# Patient Record
Sex: Male | Born: 1960 | Race: White | Hispanic: No | Marital: Married
Health system: Southern US, Community
[De-identification: ages and names within clinical notes are randomized; demographics above are authoritative.]

---

## 2017-07-31 DIAGNOSIS — Z125 Encounter for screening for malignant neoplasm of prostate: Secondary | ICD-10-CM | POA: Diagnosis not present

## 2017-07-31 DIAGNOSIS — Z13220 Encounter for screening for lipoid disorders: Secondary | ICD-10-CM | POA: Diagnosis not present

## 2017-07-31 DIAGNOSIS — Z131 Encounter for screening for diabetes mellitus: Secondary | ICD-10-CM | POA: Diagnosis not present

## 2017-08-18 DIAGNOSIS — Z0000 Encounter for general adult medical examination without abnormal findings: Secondary | ICD-10-CM | POA: Diagnosis not present

## 2017-08-18 DIAGNOSIS — Z131 Encounter for screening for diabetes mellitus: Secondary | ICD-10-CM | POA: Diagnosis not present

## 2017-08-18 DIAGNOSIS — Z13220 Encounter for screening for lipoid disorders: Secondary | ICD-10-CM | POA: Diagnosis not present

## 2017-11-10 DIAGNOSIS — M2550 Pain in unspecified joint: Secondary | ICD-10-CM | POA: Diagnosis not present

## 2017-11-10 DIAGNOSIS — R748 Abnormal levels of other serum enzymes: Secondary | ICD-10-CM | POA: Diagnosis not present

## 2017-11-10 DIAGNOSIS — R5383 Other fatigue: Secondary | ICD-10-CM | POA: Diagnosis not present

## 2017-11-11 DIAGNOSIS — R748 Abnormal levels of other serum enzymes: Secondary | ICD-10-CM | POA: Diagnosis not present

## 2017-12-04 DIAGNOSIS — R748 Abnormal levels of other serum enzymes: Secondary | ICD-10-CM | POA: Diagnosis not present

## 2018-07-01 DIAGNOSIS — M1611 Unilateral primary osteoarthritis, right hip: Secondary | ICD-10-CM | POA: Diagnosis not present

## 2018-07-24 DIAGNOSIS — M25551 Pain in right hip: Secondary | ICD-10-CM | POA: Diagnosis not present

## 2018-07-24 DIAGNOSIS — M1611 Unilateral primary osteoarthritis, right hip: Secondary | ICD-10-CM | POA: Diagnosis not present

## 2018-08-03 DIAGNOSIS — R0602 Shortness of breath: Secondary | ICD-10-CM | POA: Diagnosis not present

## 2018-08-03 DIAGNOSIS — R002 Palpitations: Secondary | ICD-10-CM | POA: Diagnosis not present

## 2018-08-20 DIAGNOSIS — Z13220 Encounter for screening for lipoid disorders: Secondary | ICD-10-CM | POA: Diagnosis not present

## 2018-08-20 DIAGNOSIS — Z131 Encounter for screening for diabetes mellitus: Secondary | ICD-10-CM | POA: Diagnosis not present

## 2018-08-20 DIAGNOSIS — R002 Palpitations: Secondary | ICD-10-CM | POA: Diagnosis not present

## 2018-08-20 DIAGNOSIS — Z125 Encounter for screening for malignant neoplasm of prostate: Secondary | ICD-10-CM | POA: Diagnosis not present

## 2018-08-25 DIAGNOSIS — Z0000 Encounter for general adult medical examination without abnormal findings: Secondary | ICD-10-CM | POA: Diagnosis not present

## 2018-08-25 DIAGNOSIS — Z125 Encounter for screening for malignant neoplasm of prostate: Secondary | ICD-10-CM | POA: Diagnosis not present

## 2018-08-25 DIAGNOSIS — R748 Abnormal levels of other serum enzymes: Secondary | ICD-10-CM | POA: Diagnosis not present

## 2019-09-25 ENCOUNTER — Ambulatory Visit: Payer: Self-pay | Attending: Internal Medicine

## 2019-09-25 DIAGNOSIS — Z23 Encounter for immunization: Secondary | ICD-10-CM

## 2019-09-25 NOTE — Progress Notes (Signed)
   Covid-19 Vaccination Clinic  Name:  Khaliel Morey    MRN: [id number] DOB: 11-12-1960  09/25/2019  Mr. Zorn was observed post Covid-19 immunization for 15 minutes without incident. He was provided with Vaccine Information Sheet and instruction to access the V-Safe system.   Mr. Scicchitano was instructed to call 911 with any severe reactions post vaccine: Marland Kitchen Difficulty breathing  . Swelling of face and throat  . A fast heartbeat  . A bad rash all over body  . Dizziness and weakness   Immunizations Administered    Name Date Dose VIS Date Route   Pfizer COVID-19 Vaccine 09/25/2019 11:11 AM 0.3 mL 06/16/2018 Intramuscular   Manufacturer: [employer], [employer]   Lot: [id number]   NDC: [address]-0

## 2019-10-16 ENCOUNTER — Ambulatory Visit: Payer: Self-pay | Attending: Internal Medicine

## 2019-10-16 DIAGNOSIS — Z23 Encounter for immunization: Secondary | ICD-10-CM

## 2019-10-16 NOTE — Progress Notes (Signed)
   Covid-19 Vaccination Clinic  Name:  James Summers    MRN: [id number] DOB: [birth year]  10/16/2019  Mr. [name] was observed post Covid-19 immunization for 15 minutes without incident. He was provided with Vaccine Information Sheet and instruction to access the V-Safe system.   Mr. [name] was instructed to call 911 with any severe reactions post vaccine: Marland Kitchen Difficulty breathing  . Swelling of face and throat  . A fast heartbeat  . A bad rash all over body  . Dizziness and weakness   Immunizations Administered    Name Date Dose VIS Date Route   Pfizer COVID-19 Vaccine 10/16/2019 11:06 AM 0.3 mL 06/16/2018 Intramuscular   Manufacturer: [employer], [employer]   Lot: [id number]   NDC: [address]-1

## 2019-11-17 ENCOUNTER — Ambulatory Visit (HOSPITAL_COMMUNITY): Payer: Commercial Managed Care - PPO | Attending: Orthopedic Surgery | Admitting: Physical Therapy

## 2019-11-17 ENCOUNTER — Other Ambulatory Visit: Payer: Self-pay

## 2019-11-17 ENCOUNTER — Encounter (HOSPITAL_COMMUNITY): Payer: Self-pay | Admitting: Physical Therapy

## 2019-11-17 DIAGNOSIS — M25551 Pain in right hip: Secondary | ICD-10-CM | POA: Insufficient documentation

## 2019-11-17 NOTE — Therapy (Signed)
[facility] [address] [place], [place], [address] Phone: [phone]   Fax:  [phone]  Physical Therapy Evaluation  Patient Details  Name: James Summers MRN: [id number] Date of Birth: Sep 08, 1960 Referring Provider (PT): Margarita Rana, MD   Encounter Date: 11/17/2019   PT End of Session - 11/17/19 1448    Visit Number 1    Number of Visits 12    Date for PT Re-Evaluation 12/29/19    Authorization Type UMR/UHC (No VL, no auth required)    Progress Note Due on Visit 10    PT Start Time 0815    PT Stop Time 0855    PT Time Calculation (min) 40 min    Equipment Utilized During Treatment Gait belt    Activity Tolerance Patient tolerated treatment well    Behavior During Therapy [facility] for tasks assessed/performed           History reviewed. No pertinent past medical history.  History reviewed. No pertinent surgical history.  There were no vitals filed for this visit.    Subjective Assessment - 11/17/19 0825    Subjective Patient reported having a Right THA on 11/11/19. Patient denied any complications following the surgery. Patient did not have any HHPT following surgery. Patient reported that the only precaution he has currently is to not turn his foot outward. Patient is currently walking with a RW, but reported that prior to surgery he was walking without an AD.    Pertinent History RT THA anterior approach 11/11/19    Limitations Standing;Walking;House hold activities    Patient Stated Goals Improved walking and less pain    Currently in Pain? Yes    Pain Score 4     Pain Location Hip    Pain Orientation Right    Pain Descriptors / Indicators Aching    Pain Type Acute pain;Surgical pain    Pain Onset 1 to 4 weeks ago    Pain Frequency Constant    Aggravating Factors  Sitting    Pain Relieving Factors Sitting with his legs elevated              [facility] PT Assessment - 11/17/19 0001      Assessment   Medical Diagnosis  S/P RT anterior THR    Referring Provider (PT) Margarita Rana, MD    Onset Date/Surgical Date 11/11/19    Next MD Visit 12/01/19    Prior Therapy Yes, for ankle and neck      Precautions   Precautions Anterior Hip    Precaution Comments Patient stated he was told not to turn his foot out      Restrictions   Weight Bearing Restrictions No      Balance Screen   Has the patient fallen in the past 6 months No      Home Environment   Living Environment Private residence    Living Arrangements Spouse/significant other    Type of Home House    Home Access Ramped entrance    Home Layout One level    Home Equipment Walker - 2 wheels;Crutches;Cane - single point      Prior Function   Level of Independence Independent    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, carry climbing, bending, twisting      Cognition   Overall Cognitive Status Within Functional Limits for tasks assessed      Observation/Other Assessments   Focus on Therapeutic Outcomes (  FOTO)  32.1%      ROM / Strength   AROM / PROM / Strength Strength      Strength   Strength Assessment Site Hip;Knee;Ankle    Right/Left Hip Right;Left    Right Hip Flexion 2/5    Right Hip Extension 3/5   Tested seated   Right Hip ABduction 3/5   Tested seated   Left Hip Flexion 5/5    Left Hip Extension --   Max resistance - tested seated   Left Hip ABduction --   Max resistance tested seated   Right/Left Knee Right;Left    Right Knee Flexion 3+/5    Right Knee Extension 3-/5    Left Knee Flexion 5/5    Left Knee Extension 5/5    Right/Left Ankle Right;Left    Right Ankle Dorsiflexion 5/5    Left Ankle Dorsiflexion 5/5      Transfers   Comments SIt to stand from chair with UE use bil      Ambulation/Gait   Ambulation/Gait Yes    Ambulation Distance (Feet) 180 Feet     Assistive device Rolling walker    Gait Pattern Antalgic;Decreased stance time - right       Balance   Balance Assessed Yes      Static Standing Balance   Static Standing - Balance Support No upper extremity supported    Static Standing Balance -  Activities  Single Leg Stance - Right Leg;Single Leg Stance - Left Leg    Static Standing - Comment/# of Minutes LT LE: >30 seconds; RT LE: 0 seconds                      Objective measurements completed on examination: See above findings.               PT Education - 11/17/19 1447    Education Details Discussed examination findings, POC, and initial HEP.    Person(s) Educated Patient    Methods Explanation;Handout    Comprehension Verbalized understanding            PT Short Term Goals - 11/17/19 1437      PT SHORT TERM GOAL #1   Title Patient will report understanding and regular compliance with HEP to improve strength, mobility and decrease pain.    Time 3    Period Weeks    Status New    Target Date 12/08/19             PT Long Term Goals - 11/17/19 1437      PT LONG TERM GOAL #1   Title Patient will demonstrate ability to ambulate at least 450 feet on the with LRAD for improved household and community ambulation.    Time 6    Period Weeks    Status New    Target Date 12/29/19      PT LONG TERM GOAL #2   Title Patient will demonstrate ability to perform SLS for at least 15 seconds on the right LE indicating improved balance and safety.    Time 6    Period Weeks    Status New    Target Date 12/29/19      PT LONG TERM GOAL #3   Title Patient will demonstrate ability to perform sit to stand transfer without upper extremity indicating improved LE strength for improved ease of mobility.    Time 6    Period Weeks    Status New  Target Date 12/29/19      PT LONG TERM GOAL #4   Title Patient will demonstrate improvement on FOTO of at least 10% indicating improvement in overall functional mobility.    Time 6    Period Weeks    Status New    Target Date 12/29/19                   Plan - 11/17/19 1449    Clinical Impression Statement Patient is a 59 year old male who presents to outpatient physical therapy S/P RT THA via anterior approach on 11/11/19. Patient presents with post-operative deficits including decreased RT LE strength, decreased ROM and mobility, decreased functional strength, decreased balance and gait deviations as well as decreased gait velocity. Patient was educated on an initial HEP. Patient would benefit from continued skilled physical therapy to address the abovementioned deficits and help patient return to his PLOF.    Personal Factors and Comorbidities Age;Comorbidity 1    Comorbidities Arthritis    Examination-Activity Limitations Stand;Locomotion Level;Bend;Dressing;Stairs;Squat    Examination-Participation Restrictions Yard Work;Meal Prep;Community Activity;Driving    Stability/Clinical Decision Making Stable/Uncomplicated    Clinical Decision Making Low    Rehab Potential Good    PT Frequency 2x / week    PT Duration 6 weeks    PT Treatment/Interventions ADLs/Self Care Home Management;Aquatic Therapy;Cryotherapy;Electrical Stimulation;Moist Heat;DME Instruction;Gait training;Stair training;Functional mobility training;Therapeutic activities;Therapeutic exercise;Balance training;Neuromuscular re-education;Patient/family education;Orthotic Fit/Training;Manual techniques;Passive range of motion;Dry needling;Taping    PT Next Visit Plan Review eval and goals, focus on gait training, LE strengthening, and hip ROM. Anterior total hip - told not to turn foot out.    PT Home Exercise Plan 11/17/19: Heel raises, toe raises, hip abduction at counter    Consulted and Agree with Plan of Care Patient           Patient will benefit from skilled therapeutic intervention in order to improve the following deficits and impairments:  Abnormal gait, Improper body mechanics, Pain, Decreased mobility, Decreased activity tolerance, Decreased endurance,  Decreased range of motion, Decreased strength, Hypomobility, Decreased balance, Difficulty walking  Visit Diagnosis: Pain in right hip     Problem List There are no problems to display for this patient.  Verne Carrow PT, DPT 2:57 PM, 11/17/19 [phone]  [facility] [address] [place], [place], [address] Phone: [phone]   Fax:  [phone]  Name: James Summers MRN: [id number] Date of Birth: 12-15-1960

## 2019-11-17 NOTE — Patient Instructions (Signed)
  Toe / Heel Raise (Standing)    Standing with support, raise heels repeat 10 times. Then raise toes and repeat 10 times. Stand at countertop for support.   Copyright  VHI. All rights reserved.  ABDUCTION: Standing - Resistance Band (Active)    Stand, feet flat. Without resistance band, lift right leg out to side. Stand at counter for support.  Complete _1__ sets of _10__ repetitions. Repeat on the left. Perform _10__ times. Perform 1-2 times a day.   http://gtsc.exer.us/116   Copyright  VHI. All rights reserved.

## 2019-11-19 ENCOUNTER — Encounter (HOSPITAL_COMMUNITY): Payer: Self-pay | Admitting: Physical Therapy

## 2019-11-19 ENCOUNTER — Ambulatory Visit (HOSPITAL_COMMUNITY): Payer: Commercial Managed Care - PPO | Admitting: Physical Therapy

## 2019-11-19 ENCOUNTER — Other Ambulatory Visit: Payer: Self-pay

## 2019-11-19 DIAGNOSIS — M25551 Pain in right hip: Secondary | ICD-10-CM

## 2019-11-19 NOTE — Therapy (Signed)
[facility] [address] [place], [place], [address] Phone: [phone]   Fax:  [phone]  Physical Therapy Treatment  Patient Details  Name: James Summers MRN: [id number] Date of Birth: 06/21/1960 Referring Provider (PT): Margarita Rana, MD   Encounter Date: 11/19/2019   PT End of Session - 11/19/19 0848    Visit Number 2    Number of Visits 12    Date for PT Re-Evaluation 12/29/19    Authorization Type UMR/UHC (No VL, no auth required)    Progress Note Due on Visit 10    PT Start Time [phone]   pt late to session   PT Stop Time 0912    PT Time Calculation (min) 23 min    Equipment Utilized During Treatment Gait belt    Activity Tolerance Patient tolerated treatment well    Behavior During Therapy [facility] for tasks assessed/performed           History reviewed. No pertinent past medical history.  History reviewed. No pertinent surgical history.  There were no vitals filed for this visit.   Subjective Assessment - 11/19/19 0853    Subjective States pain is about the 3/10 in right hip all around hip, described as achy. Movement hurts. States he was doing his exercises yesterday with standing and was doing both legs and afterwards he took a step and he had a sharp pain in the hip general area and then it resulted in burning sensation in groin area and that lasted 2-3 hours. States today he is back to baseline pain symptoms.    Pertinent History RT THA anterior approach 11/11/19    Limitations Standing;Walking;House hold activities    Patient Stated Goals Improved walking and less pain    Currently in Pain? Yes    Pain Score 3     Pain Onset 1 to 4 weeks ago              [facility] PT Assessment - 11/19/19 0001      Assessment   Medical Diagnosis S/P RT anterior THR    Referring Provider (PT) Margarita Rana, MD    Next MD Visit 12/01/19                         [facility] Adult PT Treatment/Exercise - 11/19/19 0001       Exercises   Exercises Knee/Hip      Knee/Hip Exercises: Standing   Other Standing Knee Exercises weight shifts to R leg 50% WB - 5 minutes      Knee/Hip Exercises: Supine   Other Supine Knee/Hip Exercises heel slides wiht strap x10 --> PROM by PT R - 4 minutes      Manual Therapy   Manual Therapy Soft tissue mobilization    Manual therapy comments all manual interventions performed independently of other interventions    Soft tissue mobilization R lateral hip to reduce edema - 6 minutes                  PT Education - 11/19/19 0912    Education Details educated patient in elevation, ROM, stopping exercises where he has to WB on R 100%, on taking medication as prescribed by MD (antiinflammatories prescribed but patient not currently taking). On self massage    Person(s) Educated Patient    Methods Explanation    Comprehension Verbalized understanding            PT Short Term Goals -  11/17/19 1437      PT SHORT TERM GOAL #1   Title Patient will report understanding and regular compliance with HEP to improve strength, mobility and decrease pain.    Time 3    Period Weeks    Status New    Target Date 12/08/19             PT Long Term Goals - 11/17/19 1437      PT LONG TERM GOAL #1   Title Patient will demonstrate ability to ambulate at least 450 feet on the with LRAD for improved household and community ambulation.    Time 6    Period Weeks    Status New    Target Date 12/29/19      PT LONG TERM GOAL #2   Title Patient will demonstrate ability to perform SLS for at least 15 seconds on the right LE indicating improved balance and safety.    Time 6    Period Weeks    Status New    Target Date 12/29/19      PT LONG TERM GOAL #3   Title Patient will demonstrate ability to perform sit to stand transfer without upper extremity indicating improved LE strength for improved ease of mobility.    Time 6    Period Weeks    Status New    Target Date 12/29/19        PT LONG TERM GOAL #4   Title Patient will demonstrate improvement on FOTO of at least 10% indicating improvement in overall functional mobility.    Time 6    Period Weeks    Status New    Target Date 12/29/19                 Plan - 11/19/19 7628    Clinical Impression Statement Session limited secondary to patient's late arrival. Focus was on education and hip ROM. Increased pain noted in hip with recent reports of sharp pain. Assessed hip and bandage with no signs of redness or irritation. Swelling noted throughout hip so edema massage performed, this improved tolerance to heel slide. Added heel slide and weight shifts to HEP and told patient to discontinue 3 way hip on L.    Personal Factors and Comorbidities Age;Comorbidity 1    Comorbidities Arthritis    Examination-Activity Limitations Stand;Locomotion Level;Bend;Dressing;Stairs;Squat    Examination-Participation Restrictions Yard Work;Meal Prep;Community Activity;Driving    Stability/Clinical Decision Making Stable/Uncomplicated    Rehab Potential Good    PT Frequency 2x / week    PT Duration 6 weeks    PT Treatment/Interventions ADLs/Self Care Home Management;Aquatic Therapy;Cryotherapy;Electrical Stimulation;Moist Heat;DME Instruction;Gait training;Stair training;Functional mobility training;Therapeutic activities;Therapeutic exercise;Balance training;Neuromuscular re-education;Patient/family education;Orthotic Fit/Training;Manual techniques;Passive range of motion;Dry needling;Taping    PT Next Visit Plan PROM hip as tolerated ( no ER per pt retriction report), edema massage,  gait training, LE strengthening, and hip ROM. Anterior total hip - told not to turn foot out.    PT Home Exercise Plan 11/17/19: Heel raises, toe raises, hip abduction at counter    Consulted and Agree with Plan of Care Patient           Patient will benefit from skilled therapeutic intervention in order to improve the following deficits and  impairments:  Abnormal gait, Improper body mechanics, Pain, Decreased mobility, Decreased activity tolerance, Decreased endurance, Decreased range of motion, Decreased strength, Hypomobility, Decreased balance, Difficulty walking  Visit Diagnosis: Pain in right hip     Problem List There  are no problems to display for this patient.  9:20 AM, 11/19/19 Tereasa Coop, DPT Physical Therapy with [facility]  [phone] office  [facility] [address] [place], [place], [address] Phone: [phone]   Fax:  [phone]  Name: Raffaele Derise MRN: [id number] Date of Birth: Sep 19, 1960

## 2019-11-22 ENCOUNTER — Encounter (HOSPITAL_COMMUNITY): Payer: Self-pay | Admitting: Physical Therapy

## 2019-11-24 ENCOUNTER — Other Ambulatory Visit: Payer: Self-pay

## 2019-11-24 ENCOUNTER — Ambulatory Visit (HOSPITAL_COMMUNITY): Payer: Commercial Managed Care - PPO | Attending: Orthopedic Surgery | Admitting: Physical Therapy

## 2019-11-24 DIAGNOSIS — M25551 Pain in right hip: Secondary | ICD-10-CM | POA: Diagnosis present

## 2019-11-24 NOTE — Therapy (Signed)
[facility] [address] [place], [place], [address] Phone: [phone]   Fax:  [phone]  Physical Therapy Treatment  Patient Details  Name: James Summers MRN: [id number] Date of Birth: Feb 09, 1961 Referring Provider (PT): Margarita Rana, MD   Encounter Date: 11/24/2019   PT End of Session - 11/24/19 1008    Visit Number 3    Number of Visits 12    Date for PT Re-Evaluation 12/29/19    Authorization Type UMR/UHC (No VL, no auth required)    Progress Note Due on Visit 10    PT Start Time 0838    PT Stop Time 0922    PT Time Calculation (min) 44 min    Equipment Utilized During Treatment Gait belt    Activity Tolerance Patient tolerated treatment well    Behavior During Therapy [facility] for tasks assessed/performed           No past medical history on file.  No past surgical history on file.  There were no vitals filed for this visit.   Subjective Assessment - 11/24/19 0842    Subjective pt with noted antalgia using RW.  States pain is only around 1/10 but if looses balance and places weight on LE quick and hard the pain increases.    Currently in Pain? Yes    Pain Score 1     Pain Location Hip    Pain Orientation Right    Pain Descriptors / Indicators Aching                             [facility] Adult PT Treatment/Exercise - 11/24/19 0001      Ambulation/Gait   Ambulation/Gait Yes    Ambulation/Gait Assistance 5: Supervision    Ambulation Distance (Feet) 100 Feet    Assistive device Straight cane;Small based quad cane    Gait Pattern Antalgic;Decreased stance time - right    Gait Comments cues for sequencing but overall good quality, no pain      Knee/Hip Exercises: Standing   Heel Raises Both;15 reps    Forward Lunges Both;10 reps    Forward Lunges Limitations onto 4" step with UE assist    Hip Abduction Both;10 reps    Hip Extension Both;10 reps    Rocker Board 1 minute    Rocker Board Limitations A/P  and Rt/Lt 10 reps each then balance in middle 1 minute each    Other Standing Knee Exercises tandem stance 30" X 2 in parallel bars      Manual Therapy   Manual Therapy Soft tissue mobilization    Manual therapy comments all manual interventions performed independently of other interventions    Soft tissue mobilization supine to R lateral hip to reduce edema - 6 minutes                  PT Education - 11/24/19 1004    Education Details instructed with gait training using SPC.  Encouraged to practice indoors keeping mind his pain response but continue using RW outdoor/community.    Person(s) Educated Patient    Methods Explanation;Demonstration;Tactile cues;Verbal cues    Comprehension Verbalized understanding;Returned demonstration            PT Short Term Goals - 11/17/19 1437      PT SHORT TERM GOAL #1   Title Patient will report understanding and regular compliance with HEP to improve strength, mobility and decrease pain.  Time 3    Period Weeks    Status New    Target Date 12/08/19             PT Long Term Goals - 11/17/19 1437      PT LONG TERM GOAL #1   Title Patient will demonstrate ability to ambulate at least 450 feet on the with LRAD for improved household and community ambulation.    Time 6    Period Weeks    Status New    Target Date 12/29/19      PT LONG TERM GOAL #2   Title Patient will demonstrate ability to perform SLS for at least 15 seconds on the right LE indicating improved balance and safety.    Time 6    Period Weeks    Status New    Target Date 12/29/19      PT LONG TERM GOAL #3   Title Patient will demonstrate ability to perform sit to stand transfer without upper extremity indicating improved LE strength for improved ease of mobility.    Time 6    Period Weeks    Status New    Target Date 12/29/19      PT LONG TERM GOAL #4   Title Patient will demonstrate improvement on FOTO of at least 10% indicating improvement in  overall functional mobility.    Time 6    Period Weeks    Status New    Target Date 12/29/19                 Plan - 11/24/19 1022    Clinical Impression Statement Pt comes to therapy using RW this session. Reports compliance with HEP. Progressed per deficits working on improving LE strength, balance and progressing gait to LRAD. Pt able to complete all exercise without complaints of pain and demonstrates safe and correct gait using SPC as well. Pt encouraged to practice this at home and add balance exercises as well to HEP (will need printouts next visit).    Personal Factors and Comorbidities Age;Comorbidity 1    Comorbidities Arthritis    Examination-Activity Limitations Stand;Locomotion Level;Bend;Dressing;Stairs;Squat    Examination-Participation Restrictions Yard Work;Meal Prep;Community Activity;Driving    Stability/Clinical Decision Making Stable/Uncomplicated    Rehab Potential Good    PT Frequency 2x / week    PT Duration 6 weeks    PT Treatment/Interventions ADLs/Self Care Home Management;Aquatic Therapy;Cryotherapy;Electrical Stimulation;Moist Heat;DME Instruction;Gait training;Stair training;Functional mobility training;Therapeutic activities;Therapeutic exercise;Balance training;Neuromuscular re-education;Patient/family education;Orthotic Fit/Training;Manual techniques;Passive range of motion;Dry needling;Taping    PT Next Visit Plan Continue to progress gait to LRAD, LE strengthening and stabiltiy.  Update HEP next session to include static balance and standing hip strengthening.  instructed to bring his cane next session to ensure it is adjusted correctly.    PT Home Exercise Plan 11/17/19: Heel raises, toe raises, hip abduction at counter    Consulted and Agree with Plan of Care Patient           Patient will benefit from skilled therapeutic intervention in order to improve the following deficits and impairments:  Abnormal gait, Improper body mechanics, Pain,  Decreased mobility, Decreased activity tolerance, Decreased endurance, Decreased range of motion, Decreased strength, Hypomobility, Decreased balance, Difficulty walking  Visit Diagnosis: Pain in right hip     Problem List There are no problems to display for this patient.  Lurena Nida, PTA/CLT [phone]  Emeline Gins B 11/24/2019, 10:26 AM  [facility] [address] [place], [place], [address] Phone: [phone]   Fax:  [phone]  Name: Yakov Bergen MRN: [id number] Date of Birth: June 14, 1960

## 2019-11-26 ENCOUNTER — Encounter (HOSPITAL_COMMUNITY): Payer: Self-pay | Admitting: Physical Therapy

## 2019-11-26 ENCOUNTER — Ambulatory Visit (HOSPITAL_COMMUNITY): Payer: Commercial Managed Care - PPO | Admitting: Physical Therapy

## 2019-11-26 ENCOUNTER — Other Ambulatory Visit: Payer: Self-pay

## 2019-11-26 DIAGNOSIS — M25551 Pain in right hip: Secondary | ICD-10-CM | POA: Diagnosis not present

## 2019-11-26 NOTE — Patient Instructions (Signed)
HIP / KNEE: Extension - Standing    Squeeze glutes. Raise and lift leg backward. Keep knee straight or slightly bent. _10__ reps per set, _1-2__ sets per day, _7__ days per week Hold onto a support (counter).  Copyright  VHI. All rights reserved.  Feet Heel-Toe "Tandem", Varied Arm Positions - Eyes Open    With eyes open, right foot directly in front of the other, look straight ahead at a stationary object. Stand near a countertop. Hold _10-30___ seconds. Repeat _3___ times per session. Do _1-2___ sessions per day.  Copyright  VHI. All rights reserved.

## 2019-11-26 NOTE — Therapy (Signed)
[facility] [address] [place], [place], [address] Phone: [phone]   Fax:  [phone]  Physical Therapy Treatment  Patient Details  Name: James Summers MRN: [id number] Date of Birth: [birth year] Referring Provider (PT): Margarita Rana, MD   Encounter Date: 11/26/2019   PT End of Session - 11/26/19 0819    Visit Number 4    Number of Visits 12    Date for PT Re-Evaluation 12/29/19    Authorization Type UMR/UHC (No VL, no auth required)    Progress Note Due on Visit 10    PT Start Time 0816    PT Stop Time 0854    PT Time Calculation (min) 38 min    Equipment Utilized During Treatment Gait belt    Activity Tolerance Patient tolerated treatment well    Behavior During Therapy [facility] for tasks assessed/performed           History reviewed. No pertinent past medical history.  History reviewed. No pertinent surgical history.  There were no vitals filed for this visit.   Subjective Assessment - 11/26/19 0818    Subjective Patient reported he is having 1/10 pain currently.    Currently in Pain? Yes    Pain Score 1     Pain Location Hip    Pain Orientation Right    Pain Descriptors / Indicators Aching                             [facility] Adult PT Treatment/Exercise - 11/26/19 0001      Ambulation/Gait   Ambulation/Gait Yes    Ambulation/Gait Assistance 5: Supervision    Ambulation Distance (Feet) 400 Feet    Assistive device Straight cane;Small based quad cane    Gait Pattern Antalgic;Decreased stance time - right    Gait Comments Overall good quality, no increased pain      Knee/Hip Exercises: Standing   Heel Raises Both;15 reps    Heel Raises Limitations Toe raises    Forward Lunges Both;10 reps    Forward Lunges Limitations onto 4" step without UE assist    Hip Abduction Both;10 reps    Hip Extension Both;10 reps    Forward Step Up 1 set;10 reps;Step Height: 4";Both    Forward Step Up Limitations  intermittent HHA    Rocker Board 1 minute    Rocker Board Limitations A/P and Rt/Lt 10 reps each then balance in middle 1 minute each    SLS with Vectors Forward, side, back 5x 3'' HHA as needed. Discontinued due to discomfort when standing on LLE    Other Standing Knee Exercises tandem stance 30" X 2 in parallel bars                    PT Short Term Goals - 11/17/19 1437      PT SHORT TERM GOAL #1   Title Patient will report understanding and regular compliance with HEP to improve strength, mobility and decrease pain.    Time 3    Period Weeks    Status New    Target Date 12/08/19             PT Long Term Goals - 11/17/19 1437      PT LONG TERM GOAL #1   Title Patient will demonstrate ability to ambulate at least 450 feet on the with LRAD for improved household and community ambulation.    Time 6  Period Weeks    Status New    Target Date 12/29/19      PT LONG TERM GOAL #2   Title Patient will demonstrate ability to perform SLS for at least 15 seconds on the right LE indicating improved balance and safety.    Time 6    Period Weeks    Status New    Target Date 12/29/19      PT LONG TERM GOAL #3   Title Patient will demonstrate ability to perform sit to stand transfer without upper extremity indicating improved LE strength for improved ease of mobility.    Time 6    Period Weeks    Status New    Target Date 12/29/19      PT LONG TERM GOAL #4   Title Patient will demonstrate improvement on FOTO of at least 10% indicating improvement in overall functional mobility.    Time 6    Period Weeks    Status New    Target Date 12/29/19                 Plan - 11/26/19 0911    Clinical Impression Statement This session progressed patient with functional strengthening. Added forward step-ups with 4-inch step. Patient demonstrated good form with tandem stance this session, progressed to vector stance to patient's tolerance. Patient did report increased  discomfort during vector stance standing on the left LE therefore discontinued exercise with a seated rest break. Patient reported a decrease in pain symptoms following this. Performed ambulation with cane and discussed slowly progressing to using cane intermittently at home. Also, discussed updated HEP. Patient reported that he was feeling tired but not bad at the end of the session.    Personal Factors and Comorbidities Age;Comorbidity 1    Comorbidities Arthritis    Examination-Activity Limitations Stand;Locomotion Level;Bend;Dressing;Stairs;Squat    Examination-Participation Restrictions Yard Work;Meal Prep;Community Activity;Driving    Stability/Clinical Decision Making Stable/Uncomplicated    Rehab Potential Good    PT Frequency 2x / week    PT Duration 6 weeks    PT Treatment/Interventions ADLs/Self Care Home Management;Aquatic Therapy;Cryotherapy;Electrical Stimulation;Moist Heat;DME Instruction;Gait training;Stair training;Functional mobility training;Therapeutic activities;Therapeutic exercise;Balance training;Neuromuscular re-education;Patient/family education;Orthotic Fit/Training;Manual techniques;Passive range of motion;Dry needling;Taping    PT Next Visit Plan Continue to progress gait to LRAD, LE strengthening and stabiltiy. Continue forward step ups and add lateral step ups as able.    PT Home Exercise Plan 11/17/19: Heel raises, toe raises, hip abduction at counter; 11/26/19: Tandem stance balance and hip extension at counter    Consulted and Agree with Plan of Care Patient           Patient will benefit from skilled therapeutic intervention in order to improve the following deficits and impairments:  Abnormal gait, Improper body mechanics, Pain, Decreased mobility, Decreased activity tolerance, Decreased endurance, Decreased range of motion, Decreased strength, Hypomobility, Decreased balance, Difficulty walking  Visit Diagnosis: Pain in right hip     Problem List There are  no problems to display for this patient.  Verne Carrow PT, DPT 9:17 AM, 11/26/19 [phone]  [facility] [address] [place], [place], [address] Phone: [phone]   Fax:  [phone]  Name: Darrio Bade MRN: [id number] Date of Birth: Jun 03, 1960

## 2019-11-29 ENCOUNTER — Encounter (HOSPITAL_COMMUNITY): Payer: Self-pay | Admitting: Physical Therapy

## 2019-12-01 ENCOUNTER — Other Ambulatory Visit: Payer: Self-pay

## 2019-12-01 ENCOUNTER — Ambulatory Visit (HOSPITAL_COMMUNITY): Payer: Commercial Managed Care - PPO | Admitting: Physical Therapy

## 2019-12-01 ENCOUNTER — Encounter (HOSPITAL_COMMUNITY): Payer: Self-pay | Admitting: Physical Therapy

## 2019-12-01 DIAGNOSIS — M25551 Pain in right hip: Secondary | ICD-10-CM | POA: Diagnosis not present

## 2019-12-01 NOTE — Therapy (Signed)
[facility] [address] [place], [place], [address] Phone: [phone]   Fax:  [phone]  Physical Therapy Treatment  Patient Details  Name: Quintyn Dombek MRN: [id number] Date of Birth: [birth year] Referring Provider (PT): Margarita Rana, MD   Encounter Date: 12/01/2019   PT End of Session - 12/01/19 0825    Visit Number 5    Number of Visits 12    Date for PT Re-Evaluation 12/29/19    Authorization Type UMR/UHC (No VL, no auth required)    Progress Note Due on Visit 10    PT Start Time 0819    PT Stop Time 0858    PT Time Calculation (min) 39 min    Equipment Utilized During Treatment Gait belt    Activity Tolerance Patient tolerated treatment well    Behavior During Therapy [facility] for tasks assessed/performed           History reviewed. No pertinent past medical history.  History reviewed. No pertinent surgical history.  There were no vitals filed for this visit.   Subjective Assessment - 12/01/19 0823    Subjective Patient reported doing well with the cane so far. He reports 1.5-2/10 pain currently.    Currently in Pain? Yes    Pain Score 2     Pain Location Hip    Pain Orientation Right    Pain Descriptors / Indicators Aching    Pain Type Acute pain;Surgical pain                             [facility] Adult PT Treatment/Exercise - 12/01/19 0001      Ambulation/Gait   Ambulation/Gait Yes    Ambulation/Gait Assistance 5: Supervision    Ambulation Distance (Feet) 400 Feet    Assistive device Straight cane;Small based quad cane    Gait Pattern Antalgic;Decreased stance time - right    Gait Comments Overall good quality, no increased pain      Knee/Hip Exercises: Standing   Heel Raises Both;20 reps    Heel Raises Limitations Toe raises    Forward Lunges Both;10 reps    Forward Lunges Limitations onto 4" step without UE assist    Hip Abduction Both;10 reps    Hip Extension Both;10 reps    Lateral Step  Up Right;Left;1 set;10 reps;Hand Hold: 2;Step Height: 4"    Forward Step Up 1 set;10 reps;Step Height: 4";Both    Forward Step Up Limitations intermittent HHA    Functional Squat 2 sets;10 reps    Functional Squat Limitations HHA as needed    Rocker Board 1 minute    Rocker Board Limitations A/P and Rt/Lt 10 reps each then balance in middle 1 minute each    Other Standing Knee Exercises tandem stance 30" X 2 in parallel bars    Other Standing Knee Exercises Marching alternting LEs 2x20 2''. Sidestepping in // bars 3 RT intermittent HHA                    PT Short Term Goals - 11/17/19 1437      PT SHORT TERM GOAL #1   Title Patient will report understanding and regular compliance with HEP to improve strength, mobility and decrease pain.    Time 3    Period Weeks    Status New    Target Date 12/08/19             PT Long Term Goals -  11/17/19 1437      PT LONG TERM GOAL #1   Title Patient will demonstrate ability to ambulate at least 450 feet on the with LRAD for improved household and community ambulation.    Time 6    Period Weeks    Status New    Target Date 12/29/19      PT LONG TERM GOAL #2   Title Patient will demonstrate ability to perform SLS for at least 15 seconds on the right LE indicating improved balance and safety.    Time 6    Period Weeks    Status New    Target Date 12/29/19      PT LONG TERM GOAL #3   Title Patient will demonstrate ability to perform sit to stand transfer without upper extremity indicating improved LE strength for improved ease of mobility.    Time 6    Period Weeks    Status New    Target Date 12/29/19      PT LONG TERM GOAL #4   Title Patient will demonstrate improvement on FOTO of at least 10% indicating improvement in overall functional mobility.    Time 6    Period Weeks    Status New    Target Date 12/29/19                 Plan - 12/01/19 0902    Clinical Impression Statement Patient progressed to  lateral step ups this session as well as functional squats and marching, not to full hip flexion, and sidestepping in // bars. Patient did report more discomfort walking to the left when he had to put more weight on his operated leg with sidestepping. Patient is progressing well and would benefit from continued skilled physical therapy to keep progressing towards functional goals.    Personal Factors and Comorbidities Age;Comorbidity 1    Comorbidities Arthritis    Examination-Activity Limitations Stand;Locomotion Level;Bend;Dressing;Stairs;Squat    Examination-Participation Restrictions Yard Work;Meal Prep;Community Activity;Driving    Stability/Clinical Decision Making Stable/Uncomplicated    Rehab Potential Good    PT Frequency 2x / week    PT Duration 6 weeks    PT Treatment/Interventions ADLs/Self Care Home Management;Aquatic Therapy;Cryotherapy;Electrical Stimulation;Moist Heat;DME Instruction;Gait training;Stair training;Functional mobility training;Therapeutic activities;Therapeutic exercise;Balance training;Neuromuscular re-education;Patient/family education;Orthotic Fit/Training;Manual techniques;Passive range of motion;Dry needling;Taping    PT Next Visit Plan Continue to progress gait to LRAD, LE strengthening and stabiltiy. Increase step up height as able    PT Home Exercise Plan 11/17/19: Heel raises, toe raises, hip abduction at counter; 11/26/19: Tandem stance balance and hip extension at counter    Consulted and Agree with Plan of Care Patient           Patient will benefit from skilled therapeutic intervention in order to improve the following deficits and impairments:  Abnormal gait, Improper body mechanics, Pain, Decreased mobility, Decreased activity tolerance, Decreased endurance, Decreased range of motion, Decreased strength, Hypomobility, Decreased balance, Difficulty walking  Visit Diagnosis: Pain in right hip     Problem List There are no problems to display for this  patient.  Verne Carrow PT, DPT 9:03 AM, 12/01/19 [phone]  [facility] [address] [place], [place], [address] Phone: [phone]   Fax:  [phone]  Name: Shankar Silber MRN: [id number] Date of Birth: [birth year]

## 2019-12-03 ENCOUNTER — Other Ambulatory Visit: Payer: Self-pay

## 2019-12-03 ENCOUNTER — Encounter (HOSPITAL_COMMUNITY): Payer: Self-pay

## 2019-12-03 ENCOUNTER — Ambulatory Visit (HOSPITAL_COMMUNITY): Payer: Commercial Managed Care - PPO

## 2019-12-03 DIAGNOSIS — M25551 Pain in right hip: Secondary | ICD-10-CM

## 2019-12-03 NOTE — Therapy (Signed)
[facility] [address] [place], [place], [address] Phone: [phone]   Fax:  [phone]  Physical Therapy Treatment  Patient Details  Name: James Summers MRN: [id number] Date of Birth: Dec 10, 1960 Referring Provider (PT): Margarita Rana, MD   Encounter Date: 12/03/2019   PT End of Session - 12/03/19 0846    Visit Number 6    Number of Visits 12    Date for PT Re-Evaluation 12/29/19    Authorization Type UMR/UHC (No VL, no auth required)    Progress Note Due on Visit 10    PT Start Time 0837   late arrival   PT Stop Time 0918    PT Time Calculation (min) 41 min    Equipment Utilized During Treatment Gait belt    Activity Tolerance Patient tolerated treatment well    Behavior During Therapy [facility] for tasks assessed/performed           History reviewed. No pertinent past medical history.  History reviewed. No pertinent surgical history.  There were no vitals filed for this visit.   Subjective Assessment - 12/03/19 0842    Subjective Pt arrived with hurrycane, reports hip feels stiff today.  Pain scale 1/10 Rt hip today.  Saw MD on Wednesday, was given antibiotics to address redness/irraitation on incision.    Pertinent History RT THA anterior approach 11/11/19    Patient Stated Goals Improved walking and less pain    Currently in Pain? Yes    Pain Score 1     Pain Location Hip    Pain Orientation Right    Pain Descriptors / Indicators Tightness    Pain Type Acute pain;Surgical pain    Aggravating Factors  Sitting    Pain Relieving Factors sitting wiht his leg elevated              [facility] PT Assessment - 12/03/19 0001      Assessment   Medical Diagnosis S/P RT anterior THR    Referring Provider (PT) Margarita Rana, MD    Onset Date/Surgical Date 11/11/19    Next MD Visit 12/29/19    Prior Therapy Yes, for ankle and neck      Precautions   Precautions Anterior Hip    Precaution Comments Patient stated he was told not  to turn his foot out                         [facility] Adult PT Treatment/Exercise - 12/03/19 0001      Ambulation/Gait   Ambulation/Gait Yes    Ambulation/Gait Assistance 5: Supervision    Ambulation Distance (Feet) 226 Feet    Assistive device Straight cane    Gait Pattern Step-through pattern;Decreased stance time - right    Gait Comments Good sequence      Knee/Hip Exercises: Standing   Heel Raises Both;20 reps    Heel Raises Limitations Toe raises, incline slope    Lateral Step Up Right;10 reps;Hand Hold: 1;Step Height: 4"    Forward Step Up Right;10 reps;Hand Hold: 1;Step Height: 4"    Step Down Right;10 reps;Hand Hold: 1;Step Height: 4"    Functional Squat 10 reps    Functional Squat Limitations 3D hip excursion    Rocker Board 2 minutes    Rocker Board Limitations lateral    Other Standing Knee Exercises sidestep wiht RTB around thigh 2RT down blue line    Other Standing Knee Exercises Marching alternating 2sets 10reps x 5"  with HHA required during Rt stance phase; tandem stance 2x 30" 1x with foam                    PT Short Term Goals - 11/17/19 1437      PT SHORT TERM GOAL #1   Title Patient will report understanding and regular compliance with HEP to improve strength, mobility and decrease pain.    Time 3    Period Weeks    Status New    Target Date 12/08/19             PT Long Term Goals - 11/17/19 1437      PT LONG TERM GOAL #1   Title Patient will demonstrate ability to ambulate at least 450 feet on the with LRAD for improved household and community ambulation.    Time 6    Period Weeks    Status New    Target Date 12/29/19      PT LONG TERM GOAL #2   Title Patient will demonstrate ability to perform SLS for at least 15 seconds on the right LE indicating improved balance and safety.    Time 6    Period Weeks    Status New    Target Date 12/29/19      PT LONG TERM GOAL #3   Title Patient will demonstrate ability to  perform sit to stand transfer without upper extremity indicating improved LE strength for improved ease of mobility.    Time 6    Period Weeks    Status New    Target Date 12/29/19      PT LONG TERM GOAL #4   Title Patient will demonstrate improvement on FOTO of at least 10% indicating improvement in overall functional mobility.    Time 6    Period Weeks    Status New    Target Date 12/29/19                 Plan - 12/03/19 1225    Clinical Impression Statement Pt continues to have difficulty with weight bearing activities due to hip weakness.  Sessoin focus with hip strengthening and additional balance training.  Progressed sidestepping with additional theraband down blue line with CGA for safety.  Attempted increasing step up height, pt too weak and tendency to jump up wiht Lt LE rather than utilize quad strengthenig so returned to 4in step height iwht improved mechanics.    Personal Factors and Comorbidities Age;Comorbidity 1    Comorbidities Arthritis    Examination-Activity Limitations Stand;Locomotion Level;Bend;Dressing;Stairs;Squat    Examination-Participation Restrictions Yard Work;Meal Prep;Community Activity;Driving    Stability/Clinical Decision Making Stable/Uncomplicated    Clinical Decision Making Low    Rehab Potential Good    PT Frequency 2x / week    PT Duration 6 weeks    PT Treatment/Interventions ADLs/Self Care Home Management;Aquatic Therapy;Cryotherapy;Electrical Stimulation;Moist Heat;DME Instruction;Gait training;Stair training;Functional mobility training;Therapeutic activities;Therapeutic exercise;Balance training;Neuromuscular re-education;Patient/family education;Orthotic Fit/Training;Manual techniques;Passive range of motion;Dry needling;Taping    PT Next Visit Plan Continue to progress gait to LRAD, LE strengthening and stabiltiy. Increase step up height as able    PT Home Exercise Plan 11/17/19: Heel raises, toe raises, hip abduction at counter;  11/26/19: Tandem stance balance and hip extension at counter           Patient will benefit from skilled therapeutic intervention in order to improve the following deficits and impairments:  Abnormal gait, Improper body mechanics, Pain, Decreased mobility, Decreased activity tolerance, Decreased  endurance, Decreased range of motion, Decreased strength, Hypomobility, Decreased balance, Difficulty walking  Visit Diagnosis: Pain in right hip     Problem List There are no problems to display for this patient.  Becky Sax, LPTA/CLT; CBIS [phone]  Juel Burrow 12/03/2019, 1:06 PM  [facility] [address] [place], [place], [address] Phone: [phone]   Fax:  [phone]  Name: Jarae Panas MRN: [id number] Date of Birth: 1960-09-19

## 2019-12-06 ENCOUNTER — Encounter (HOSPITAL_COMMUNITY): Payer: Self-pay | Admitting: Physical Therapy

## 2019-12-07 ENCOUNTER — Encounter (HOSPITAL_COMMUNITY): Payer: Commercial Managed Care - PPO | Admitting: Physical Therapy

## 2019-12-08 ENCOUNTER — Other Ambulatory Visit: Payer: Self-pay

## 2019-12-08 ENCOUNTER — Encounter (HOSPITAL_COMMUNITY): Payer: Self-pay | Admitting: Physical Therapy

## 2019-12-08 ENCOUNTER — Ambulatory Visit (HOSPITAL_COMMUNITY): Payer: Commercial Managed Care - PPO | Admitting: Physical Therapy

## 2019-12-08 DIAGNOSIS — M25551 Pain in right hip: Secondary | ICD-10-CM | POA: Diagnosis not present

## 2019-12-08 NOTE — Therapy (Signed)
[facility] [address] [place], [place], [address] Phone: [phone]   Fax:  [phone]  Physical Therapy Treatment  Patient Details  Name: James Summers MRN: [id number] Date of Birth: May 18, 1960 Referring Provider (PT): Margarita Rana, MD   Encounter Date: 12/08/2019   PT End of Session - 12/08/19 1439    Visit Number 7    Number of Visits 12    Date for PT Re-Evaluation 12/29/19    Authorization Type UMR/UHC (No VL, no auth required)    Progress Note Due on Visit 10    PT Start Time 1435    PT Stop Time 1515    PT Time Calculation (min) 40 min    Equipment Utilized During Treatment Gait belt    Activity Tolerance Patient tolerated treatment well    Behavior During Therapy [facility] for tasks assessed/performed           History reviewed. No pertinent past medical history.  History reviewed. No pertinent surgical history.  There were no vitals filed for this visit.   Subjective Assessment - 12/08/19 1438    Subjective Patient says he is doing aright. Says his hip is a little stiff, pain is minimal.    Pertinent History RT THA anterior approach 11/11/19    Patient Stated Goals Improved walking and less pain    Currently in Pain? Yes    Pain Score 1     Pain Location Hip    Pain Orientation Right    Pain Descriptors / Indicators Tightness    Pain Type Surgical pain                             [facility] Adult PT Treatment/Exercise - 12/08/19 0001      Knee/Hip Exercises: Standing   Heel Raises Both;20 reps    Heel Raises Limitations Toe raises, incline slope    Lateral Step Up Both;1 set;15 reps;Hand Hold: 1;Step Height: 6"    Forward Step Up Both;2 sets;10 reps;Step Height: 6";Hand Hold: 2    Step Down Both;1 set;15 reps;Hand Hold: 2;Step Height: 6"    Functional Squat 2 sets;10 reps   to chair for depth cue   Rocker Board 2 minutes   PF/DF/INV/EV   SLS with Vectors Forward, side, back 3x 5'' 3 finger  assist     Gait Training 226 feet with no AD     Other Standing Knee Exercises sidestep with RTB around thigh 2RT down blue line                    PT Short Term Goals - 11/17/19 1437      PT SHORT TERM GOAL #1   Title Patient will report understanding and regular compliance with HEP to improve strength, mobility and decrease pain.    Time 3    Period Weeks    Status New    Target Date 12/08/19             PT Long Term Goals - 11/17/19 1437      PT LONG TERM GOAL #1   Title Patient will demonstrate ability to ambulate at least 450 feet on the with LRAD for improved household and community ambulation.    Time 6    Period Weeks    Status New    Target Date 12/29/19      PT LONG TERM GOAL #2   Title Patient will  demonstrate ability to perform SLS for at least 15 seconds on the right LE indicating improved balance and safety.    Time 6    Period Weeks    Status New    Target Date 12/29/19      PT LONG TERM GOAL #3   Title Patient will demonstrate ability to perform sit to stand transfer without upper extremity indicating improved LE strength for improved ease of mobility.    Time 6    Period Weeks    Status New    Target Date 12/29/19      PT LONG TERM GOAL #4   Title Patient will demonstrate improvement on FOTO of at least 10% indicating improvement in overall functional mobility.    Time 6    Period Weeks    Status New    Target Date 12/29/19                 Plan - 12/08/19 1516    Clinical Impression Statement Patient well challenged with ther ex progressions today. Patient able to progress step height to 6 inch box with good form, using HHA. Patient continues to be limited in static balance, but was able to perform SLS with vectors using HHA x 1. Patient noted increased muscle fatigue with band sidestepping. Reviewed HEP exercises. Patient educated on gait sequence and encouraged to begin using cane less around home and for shirt distances  when able.    Personal Factors and Comorbidities Age;Comorbidity 1    Comorbidities Arthritis    Examination-Activity Limitations Stand;Locomotion Level;Bend;Dressing;Stairs;Squat    Examination-Participation Restrictions Yard Work;Meal Prep;Community Activity;Driving    Stability/Clinical Decision Making Stable/Uncomplicated    Rehab Potential Good    PT Frequency 2x / week    PT Duration 6 weeks    PT Treatment/Interventions ADLs/Self Care Home Management;Aquatic Therapy;Cryotherapy;Electrical Stimulation;Moist Heat;DME Instruction;Gait training;Stair training;Functional mobility training;Therapeutic activities;Therapeutic exercise;Balance training;Neuromuscular re-education;Patient/family education;Orthotic Fit/Training;Manual techniques;Passive range of motion;Dry needling;Taping    PT Next Visit Plan Continue to progress gait to LRAD, LE strengthening and stabiltiy. Increase step up height as able    PT Home Exercise Plan 11/17/19: Heel raises, toe raises, hip abduction at counter; 11/26/19: Tandem stance balance and hip extension at counter 12/08/19: sit to stand    Consulted and Agree with Plan of Care Patient           Patient will benefit from skilled therapeutic intervention in order to improve the following deficits and impairments:  Abnormal gait, Improper body mechanics, Pain, Decreased mobility, Decreased activity tolerance, Decreased endurance, Decreased range of motion, Decreased strength, Hypomobility, Decreased balance, Difficulty walking  Visit Diagnosis: Pain in right hip     Problem List There are no problems to display for this patient.   3:19 PM, 12/08/19 Georges Lynch PT DPT  Physical Therapist with [facility]  [facility]  [phone]   [facility] [address] [place], [place], [address] Phone: [phone]   Fax:  [phone]  Name: James Summers MRN: [id number] Date of Birth: 07-09-1960

## 2019-12-10 ENCOUNTER — Ambulatory Visit (HOSPITAL_COMMUNITY): Payer: Commercial Managed Care - PPO | Admitting: Physical Therapy

## 2019-12-10 ENCOUNTER — Encounter (HOSPITAL_COMMUNITY): Payer: Self-pay | Admitting: Physical Therapy

## 2019-12-10 ENCOUNTER — Other Ambulatory Visit: Payer: Self-pay

## 2019-12-10 DIAGNOSIS — M25551 Pain in right hip: Secondary | ICD-10-CM | POA: Diagnosis not present

## 2019-12-10 NOTE — Therapy (Signed)
[facility] [address] [place], [place], [address] Phone: [phone]   Fax:  [phone]  Physical Therapy Treatment  Patient Details  Name: James Summers MRN: [id number] Date of Birth: [birth year] Referring Provider (PT): Margarita Rana, MD   Encounter Date: 12/10/2019   PT End of Session - 12/10/19 1310    Visit Number 8    Number of Visits 12    Date for PT Re-Evaluation 12/29/19    Authorization Type UMR/UHC (No VL, no auth required)    Progress Note Due on Visit 10    PT Start Time 1305    PT Stop Time 1343    PT Time Calculation (min) 38 min    Equipment Utilized During Treatment Gait belt    Activity Tolerance Patient tolerated treatment well    Behavior During Therapy [facility] for tasks assessed/performed           History reviewed. No pertinent past medical history.  History reviewed. No pertinent surgical history.  There were no vitals filed for this visit.   Subjective Assessment - 12/10/19 1307    Subjective Patient reported having some hip stiffness. Patient reported that he thinks the incision around his hip looks good and denied any excessive redness.    Pertinent History RT THA anterior approach 11/11/19    Patient Stated Goals Improved walking and less pain    Currently in Pain? Yes    Pain Score 2     Pain Location Hip    Pain Orientation Right    Pain Descriptors / Indicators Tightness    Pain Type Surgical pain                             [facility] Adult PT Treatment/Exercise - 12/10/19 0001      Knee/Hip Exercises: Stretches   Lobbyist Right;3 reps;30 seconds    Quad Stretch Limitations Prone with strap       Knee/Hip Exercises: Standing   Heel Raises Both;20 reps    Heel Raises Limitations Toe raises    Lateral Step Up Both;1 set;15 reps;Hand Hold: 1;Step Height: 6"    Forward Step Up Both;2 sets;10 reps;Step Height: 6";Hand Hold: 2    Step Down Both;1 set;15 reps;Hand Hold:  2;Step Height: 6"    Functional Squat 2 sets;10 reps   With HHA   Rocker Board --   10 x LT/RT; 10x Ant/post   SLS with Vectors Forward, side, back 3x 5'' 3 light HHA    Gait Training 226 feet with no AD    Cues to increase stance time on RT                   PT Short Term Goals - 11/17/19 1437      PT SHORT TERM GOAL #1   Title Patient will report understanding and regular compliance with HEP to improve strength, mobility and decrease pain.    Time 3    Period Weeks    Status New    Target Date 12/08/19             PT Long Term Goals - 11/17/19 1437      PT LONG TERM GOAL #1   Title Patient will demonstrate ability to ambulate at least 450 feet on the with LRAD for improved household and community ambulation.    Time 6    Period Weeks    Status New  Target Date 12/29/19      PT LONG TERM GOAL #2   Title Patient will demonstrate ability to perform SLS for at least 15 seconds on the right LE indicating improved balance and safety.    Time 6    Period Weeks    Status New    Target Date 12/29/19      PT LONG TERM GOAL #3   Title Patient will demonstrate ability to perform sit to stand transfer without upper extremity indicating improved LE strength for improved ease of mobility.    Time 6    Period Weeks    Status New    Target Date 12/29/19      PT LONG TERM GOAL #4   Title Patient will demonstrate improvement on FOTO of at least 10% indicating improvement in overall functional mobility.    Time 6    Period Weeks    Status New    Target Date 12/29/19                 Plan - 12/10/19 1352    Clinical Impression Statement Patient reported hip tightness most notably in anterior hip and indicates near quad muscle. Educated patient on a prone quad stretch with a strap maintaining neutral hip alignment. Patient reported that this did feel like it was stretching the area that felt tight. Discussed with patient beginning to use floor pedals for  cycling at home as the movement will not cause the patient to violate any restrictions his MD gave him.    Personal Factors and Comorbidities Age;Comorbidity 1    Comorbidities Arthritis    Examination-Activity Limitations Stand;Locomotion Level;Bend;Dressing;Stairs;Squat    Examination-Participation Restrictions Yard Work;Meal Prep;Community Activity;Driving    Stability/Clinical Decision Making Stable/Uncomplicated    Rehab Potential Good    PT Frequency 2x / week    PT Duration 6 weeks    PT Treatment/Interventions ADLs/Self Care Home Management;Aquatic Therapy;Cryotherapy;Electrical Stimulation;Moist Heat;DME Instruction;Gait training;Stair training;Functional mobility training;Therapeutic activities;Therapeutic exercise;Balance training;Neuromuscular re-education;Patient/family education;Orthotic Fit/Training;Manual techniques;Passive range of motion;Dry needling;Taping    PT Next Visit Plan F/U on using the floor pedals at home. Continue on improving patient's right hip mobility and strength as tolerated.    PT Home Exercise Plan 11/17/19: Heel raises, toe raises, hip abduction at counter; 11/26/19: Tandem stance balance and hip extension at counter 12/08/19: sit to stand    Consulted and Agree with Plan of Care Patient           Patient will benefit from skilled therapeutic intervention in order to improve the following deficits and impairments:  Abnormal gait, Improper body mechanics, Pain, Decreased mobility, Decreased activity tolerance, Decreased endurance, Decreased range of motion, Decreased strength, Hypomobility, Decreased balance, Difficulty walking  Visit Diagnosis: Pain in right hip     Problem List There are no problems to display for this patient.  Verne Carrow PT, DPT 1:54 PM, 12/10/19 [phone]  [facility] [address] [place], [place], [address] Phone: [phone]   Fax:  [phone]  Name: Hayk Divis MRN:  [id number] Date of Birth: Sep 07, 1960

## 2019-12-13 ENCOUNTER — Encounter (HOSPITAL_COMMUNITY): Payer: Self-pay

## 2019-12-15 ENCOUNTER — Ambulatory Visit (HOSPITAL_COMMUNITY): Payer: Commercial Managed Care - PPO

## 2019-12-15 ENCOUNTER — Telehealth (HOSPITAL_COMMUNITY): Payer: Self-pay

## 2019-12-15 NOTE — Telephone Encounter (Signed)
pt cancelled appt for today because he has a dead battery

## 2019-12-17 ENCOUNTER — Encounter (HOSPITAL_COMMUNITY): Payer: Self-pay | Admitting: Physical Therapy

## 2019-12-17 ENCOUNTER — Other Ambulatory Visit: Payer: Self-pay

## 2019-12-17 ENCOUNTER — Ambulatory Visit (HOSPITAL_COMMUNITY): Payer: Commercial Managed Care - PPO | Admitting: Physical Therapy

## 2019-12-17 DIAGNOSIS — M25551 Pain in right hip: Secondary | ICD-10-CM | POA: Diagnosis not present

## 2019-12-17 NOTE — Therapy (Signed)
[facility] [address] [place], [place], [address] Phone: [phone]   Fax:  [phone]  Physical Therapy Treatment  Patient Details  Name: James Summers MRN: [id number] Date of Birth: Apr 09, 1961 Referring Provider (PT): Margarita Rana, MD   Encounter Date: 12/17/2019   PT End of Session - 12/17/19 1139    Visit Number 9    Number of Visits 12    Date for PT Re-Evaluation 12/29/19    Authorization Type UMR/UHC (No VL, no auth required)    Progress Note Due on Visit 10    PT Start Time 1119    PT Stop Time 1157    PT Time Calculation (min) 38 min    Equipment Utilized During Treatment Gait belt    Activity Tolerance Patient tolerated treatment well    Behavior During Therapy [facility] for tasks assessed/performed           History reviewed. No pertinent past medical history.  History reviewed. No pertinent surgical history.  There were no vitals filed for this visit.   Subjective Assessment - 12/17/19 1247    Subjective Patient reports that he is doing well and has minimal pain in his hip which he rates as a 1/10.    Pertinent History RT THA anterior approach 11/11/19    Patient Stated Goals Improved walking and less pain    Currently in Pain? Yes    Pain Score 1     Pain Location Hip    Pain Orientation Right    Pain Descriptors / Indicators Tightness    Pain Type Surgical pain                             [facility] Adult PT Treatment/Exercise - 12/17/19 0001      Ambulation/Gait   Ambulation/Gait Yes    Ambulation Distance (Feet) 226 Feet    Assistive device None    Gait Pattern Within Functional Limits    Gait Comments Educated patient to ambulate without AD at home and short distances initially and monitor for pain      Therapeutic Activites    Therapeutic Activities Other Therapeutic Activities    Other Therapeutic Activities Kneeling to ground and standing up holding weighted ball kneeling with LT LE  up: simulating picking a cat up from the ground x 5 onto mat       Knee/Hip Exercises: Stretches   Lobbyist Right;3 reps;30 seconds    Quad Stretch Limitations Prone with strap       Knee/Hip Exercises: Standing   Heel Raises Both;20 reps    Heel Raises Limitations Toe raises x20    Lateral Step Up Both;1 set;15 reps;Step Height: 6";Hand Hold: 0    Forward Step Up Both;2 sets;10 reps;Step Height: 6";Hand Hold: 0    Step Down Both;1 set;15 reps;Hand Hold: 2;Step Height: 6"    Functional Squat 2 sets;10 reps   HHA   SLS with Vectors Forward, side, back 3x 5'' 3 light HHA                    PT Short Term Goals - 11/17/19 1437      PT SHORT TERM GOAL #1   Title Patient will report understanding and regular compliance with HEP to improve strength, mobility and decrease pain.    Time 3    Period Weeks    Status New    Target Date 12/08/19  PT Long Term Goals - 11/17/19 1437      PT LONG TERM GOAL #1   Title Patient will demonstrate ability to ambulate at least 450 feet on the with LRAD for improved household and community ambulation.    Time 6    Period Weeks    Status New    Target Date 12/29/19      PT LONG TERM GOAL #2   Title Patient will demonstrate ability to perform SLS for at least 15 seconds on the right LE indicating improved balance and safety.    Time 6    Period Weeks    Status New    Target Date 12/29/19      PT LONG TERM GOAL #3   Title Patient will demonstrate ability to perform sit to stand transfer without upper extremity indicating improved LE strength for improved ease of mobility.    Time 6    Period Weeks    Status New    Target Date 12/29/19      PT LONG TERM GOAL #4   Title Patient will demonstrate improvement on FOTO of at least 10% indicating improvement in overall functional mobility.    Time 6    Period Weeks    Status New    Target Date 12/29/19                 Plan - 12/17/19 1245    Clinical  Impression Statement Discussed progressing to ambulating without AD short distances as patient demonstrated gait pattern with good balance. Also worked on kneeling and then returning to standing with a weighted ball. This was to simulate the patient's household chore of reaching down to get his cat to give it its medicine. He was able to demonstrate safe kneeling and returning to standing with left lower extremity bent up and right lower extremity underneath him. Patient also demonstrated improved ability to perform step ups with less upper extremity assistance. Patient reports minimal pain throughout session.    Personal Factors and Comorbidities Age;Comorbidity 1    Comorbidities Arthritis    Examination-Activity Limitations Stand;Locomotion Level;Bend;Dressing;Stairs;Squat    Examination-Participation Restrictions Yard Work;Meal Prep;Community Activity;Driving    Stability/Clinical Decision Making Stable/Uncomplicated    Rehab Potential Good    PT Frequency 2x / week    PT Duration 6 weeks    PT Treatment/Interventions ADLs/Self Care Home Management;Aquatic Therapy;Cryotherapy;Electrical Stimulation;Moist Heat;DME Instruction;Gait training;Stair training;Functional mobility training;Therapeutic activities;Therapeutic exercise;Balance training;Neuromuscular re-education;Patient/family education;Orthotic Fit/Training;Manual techniques;Passive range of motion;Dry needling;Taping    PT Next Visit Plan 10th visit PN next session. F/U on kneeling at home. Continue on improving patient's right hip mobility and strength as tolerated.    PT Home Exercise Plan 11/17/19: Heel raises, toe raises, hip abduction at counter; 11/26/19: Tandem stance balance and hip extension at counter 12/08/19: sit to stand    Consulted and Agree with Plan of Care Patient           Patient will benefit from skilled therapeutic intervention in order to improve the following deficits and impairments:  Abnormal gait, Improper body  mechanics, Pain, Decreased mobility, Decreased activity tolerance, Decreased endurance, Decreased range of motion, Decreased strength, Hypomobility, Decreased balance, Difficulty walking  Visit Diagnosis: Pain in right hip     Problem List There are no problems to display for this patient.  Verne Carrow PT, DPT 12:48 PM, 12/17/19 [phone]  [facility] [address] [place], [place], [address] Phone: [phone]   Fax:  [phone]  Name: James Summers MRN: [id number] Date of Birth: 1960/05/25

## 2019-12-20 ENCOUNTER — Ambulatory Visit (HOSPITAL_COMMUNITY): Payer: Commercial Managed Care - PPO | Admitting: Physical Therapy

## 2019-12-20 ENCOUNTER — Other Ambulatory Visit: Payer: Self-pay

## 2019-12-20 ENCOUNTER — Encounter (HOSPITAL_COMMUNITY): Payer: Self-pay | Admitting: Physical Therapy

## 2019-12-20 DIAGNOSIS — M25551 Pain in right hip: Secondary | ICD-10-CM

## 2019-12-20 NOTE — Therapy (Addendum)
[place], [place], [address] Phone: [phone]   Fax:  [phone]  Physical Therapy Treatment  Patient Details  Name: James Summers MRN: [id number] Date of Birth: 1960-06-12 Referring Provider (PT): Edmonia Lynch, MD   Encounter Date: 12/20/2019   Progress Note Reporting Period 11/17/2019  to 12/20/2019  See note below for Objective Data and Assessment of Progress/Goals.       PT End of Session - 12/20/19 1319    Visit Number 10    Number of Visits 12    Date for PT Re-Evaluation 12/29/19    Authorization Type UMR/UHC (No VL, no auth required)    Progress Note Due on Visit 10    PT Start Time 1320    PT Stop Time 1400    PT Time Calculation (min) 40 min    Equipment Utilized During Treatment Gait belt    Activity Tolerance Patient tolerated treatment well    Behavior During Therapy WFL for tasks assessed/performed           History reviewed. No pertinent past medical history.  History reviewed. No pertinent surgical history.  There were no vitals filed for this visit.   Subjective Assessment - 12/20/19 1325    Subjective PT states that he feels that he is about 80% better.  He is going without his cane inside but still using his cane outside.  It seems the initial step is still difficulty.    Pertinent History RT THA anterior approach 11/11/19    Limitations Standing;Walking;House hold activities    How long can you sit comfortably? no problem    How long can you stand comfortably? about 45 minutes to an hour.    How long can you walk comfortably? pt the longest that he has walked has been in therapy less than five minutes    Patient Stated Goals Improved walking and less pain    Currently in Pain? Yes    Pain Score 2     Pain Location Hip    Pain Orientation Right    Pain Descriptors / Indicators Tightness    Pain Type Surgical pain    Pain Onset 1 to 4 weeks ago    Pain Frequency Constant      Aggravating Factors  walking              [facility] PT Assessment - 12/20/19 0001      Assessment   Medical Diagnosis S/P RT anterior THR    Referring Provider (PT) Edmonia Lynch, MD    Next MD Visit 9/8       Observation/Other Assessments   Focus on Therapeutic Outcomes (FOTO)  20 was 44;       Functional Tests   Functional tests Single leg stance;Sit to Stand      Single Leg Stance   Comments Lt 46" was 30"; rt:60" was 0      Sit to Stand   Comments 10 in 30 "       Strength   Right Hip Flexion 5/5    Right Hip Extension 3+/5    Right Hip ABduction 5/5    Left Hip Flexion 5/5    Left Hip Extension 4-/5    Left Hip ABduction 5/5    Right Knee Flexion 5/5    Right Knee Extension 5/5    Left Knee Flexion 5/5    Left Knee Extension 5/5  [place] Adult PT Treatment/Exercise - 12/20/19 0001      Knee/Hip Exercises: Seated   Sit to Sand 10 reps   repeat with stepping out with Rt then stepping out with lt      Knee/Hip Exercises: Supine   Bridges Both;5 reps    Bridges Limitations with marching                     PT Short Term Goals - 12/20/19 1346      PT SHORT TERM GOAL #1   Title Patient will report understanding and regular compliance with HEP to improve strength, mobility and decrease pain.    Time 3    Period Weeks    Status Partially Met    Target Date 12/08/19             PT Long Term Goals - 12/20/19 1346      PT LONG TERM GOAL #1   Title Patient will demonstrate ability to ambulate at least 450 feet on the 2MWT with LRAD for improved household and community ambulation.    Baseline 8/30:  346 ft no assistive device    Time 6    Period Weeks    Status On-going      PT LONG TERM GOAL #2   Title Patient will demonstrate ability to perform SLS for at least 15 seconds on the right LE indicating improved balance and safety.    Time 6    Period Weeks    Status Achieved      PT LONG TERM GOAL #3    Title Patient will demonstrate ability to perform sit to stand transfer without upper extremity indicating improved LE strength for improved ease of mobility.    Time 6    Period Weeks    Status Achieved      PT LONG TERM GOAL #4   Title Patient will demonstrate improvement on FOTO of at least 10% indicating improvement in overall functional mobility.    Time 6    Period Weeks    Status Achieved                 Plan - 12/20/19 1401    Clinical Impression Statement Pt reassessed with only deficit being glut max strength.  Pt given slow sit to stand and bridge with marching as a HEP to improve glut strength.  Therapist again stressed the importance of increasing walking distance and going without his cane.  PT should be ready for discharge on viist 12; work on glut [employer] ,steps and confidence walking without assitive device.    Personal Factors and Comorbidities Age;Comorbidity 1    Comorbidities Arthritis    Examination-Activity Limitations Stand;Locomotion Level;Bend;Dressing;Stairs;Squat    Examination-Participation Restrictions Yard Work;Meal Prep;Community Activity;Driving    Stability/Clinical Decision Making Stable/Uncomplicated    Rehab Potential Good    PT Frequency 2x / week    PT Duration 6 weeks    PT Treatment/Interventions ADLs/Self Care Home Management;Aquatic Therapy;Cryotherapy;Electrical Stimulation;Moist Heat;DME Instruction;Gait training;Stair training;Functional mobility training;Therapeutic activities;Therapeutic exercise;Balance training;Neuromuscular re-education;Patient/family education;Orthotic Fit/Training;Manual techniques;Passive range of motion;Dry needling;Taping    PT Next Visit Plan work on glut max strengh ,steps and confidence walking without assitive device. Discharge in 2 visits may use info from today's treatment for discharge.    PT Home Exercise Plan 11/17/19: Heel raises, toe raises, hip abduction at counter; 11/26/19: Tandem stance balance  and hip extension at counter 12/08/19: sit to stand    Consulted and Agree with  Plan of Care Patient           Patient will benefit from skilled therapeutic intervention in order to improve the following deficits and impairments:  Abnormal gait, Improper body mechanics, Pain, Decreased mobility, Decreased activity tolerance, Decreased endurance, Decreased range of motion, Decreased strength, Hypomobility, Decreased balance, Difficulty walking  Visit Diagnosis: Pain in right hip     Problem List There are no problems to display for this patient.   Rayetta Humphrey, [place] CLT [phone] 12/20/2019, 2:06 PM  [place] [address] [place], [place], [address] Phone: [phone]   Fax:  [phone]  Name: James Summers MRN: [id number] Date of Birth: [birth year]

## 2019-12-21 ENCOUNTER — Encounter (HOSPITAL_COMMUNITY): Payer: Commercial Managed Care - PPO | Admitting: Physical Therapy

## 2019-12-23 ENCOUNTER — Ambulatory Visit (HOSPITAL_COMMUNITY): Payer: Commercial Managed Care - PPO | Attending: Orthopedic Surgery | Admitting: Physical Therapy

## 2019-12-23 ENCOUNTER — Other Ambulatory Visit: Payer: Self-pay

## 2019-12-23 DIAGNOSIS — M25551 Pain in right hip: Secondary | ICD-10-CM | POA: Diagnosis not present

## 2019-12-23 NOTE — Therapy (Signed)
[place], [place], [address] Phone: [phone]   Fax:  [phone]  Physical Therapy Treatment  Patient Details  Name: James Summers MRN: [id number] Date of Birth: [birth year] Referring Provider (PT): Edmonia Lynch, MD   Encounter Date: 12/23/2019   PT End of Session - 12/23/19 1306    Visit Number 11    Number of Visits 12    Date for PT Re-Evaluation 12/29/19    Authorization Type UMR/UHC (No VL, no auth required)    Progress Note Due on Visit 10    PT Start Time 1050    PT Stop Time 1135    PT Time Calculation (min) 45 min    Equipment Utilized During Treatment Gait belt    Activity Tolerance Patient tolerated treatment well    Behavior During Therapy [facility] for tasks assessed/performed           No past medical history on file.  No past surgical history on file.  There were no vitals filed for this visit.   Subjective Assessment - 12/23/19 1055    Subjective pt reports pain 1-3/10 dependending on his activity.  1/10 standing still and 3/10 with movement/increased activity.                             [place] [facility] Adult PT Treatment/Exercise - 12/23/19 0001      Knee/Hip Exercises: Standing   Hip Extension Right;2 sets;15 reps    Lateral Step Up Both;Step Height: 6";Hand Hold: 0;2 sets;10 reps    Lateral Step Up Limitations eccentric control    Forward Step Up Both;2 sets;10 reps;Step Height: 6";Hand Hold: 0    Forward Step Up Limitations with power up    Step Down Both;Step Height: 6";2 sets;10 reps;Hand Hold: 1    Stairs 7" 5RT no UE assist in reciprocal pattern    SLS with Vectors 10X5" holds each LE with 1 HHA    Gait Training 226 feet with no AD cues to reduce trendelenburg      Knee/Hip Exercises: Seated   Sit to Sand 10 reps;2 sets      Knee/Hip Exercises: Supine   Bridges Both;10 reps    Bridges Limitations with marching                     PT Short Term Goals  - 12/20/19 1346      PT SHORT TERM GOAL #1   Title Patient will report understanding and regular compliance with HEP to improve strength, mobility and decrease pain.    Time 3    Period Weeks    Status Partially Met    Target Date 12/08/19             PT Long Term Goals - 12/20/19 1346      PT LONG TERM GOAL #1   Title Patient will demonstrate ability to ambulate at least 450 feet on the 2MWT with LRAD for improved household and community ambulation.    Baseline 8/30:  346 ft no assistive device    Time 6    Period Weeks    Status On-going      PT LONG TERM GOAL #2   Title Patient will demonstrate ability to perform SLS for at least 15 seconds on the right LE indicating improved balance and safety.    Time 6    Period Weeks    Status Achieved  PT LONG TERM GOAL #3   Title Patient will demonstrate ability to perform sit to stand transfer without upper extremity indicating improved LE strength for improved ease of mobility.    Time 6    Period Weeks    Status Achieved      PT LONG TERM GOAL #4   Title Patient will demonstrate improvement on FOTO of at least 10% indicating improvement in overall functional mobility.    Time 6    Period Weeks    Status Achieved                 Plan - 12/23/19 1306    Clinical Impression Statement PN completed last session with deficit remaining Rt glute max strength.  Focused session on targeting this mm with noted fatigue at EOS.  Pt continues to ambulate with trendelenburg pattern due to Rt weakness.  Completed 2 sets and incrased reps of most exercises today.  Focused on eccentric lowering and overall slow contractions with therex.  Pt able to negotiate 7" steps without UE this session.  Encouraged continuation of gait without AD.    Personal Factors and Comorbidities Age;Comorbidity 1    Comorbidities Arthritis    Examination-Activity Limitations Stand;Locomotion Level;Bend;Dressing;Stairs;Squat    Examination-Participation  Restrictions Yard Work;Meal Prep;Community Activity;Driving    Stability/Clinical Decision Making Stable/Uncomplicated    Rehab Potential Good    PT Frequency 2x / week    PT Duration 6 weeks    PT Treatment/Interventions ADLs/Self Care Home Management;Aquatic Therapy;Cryotherapy;Electrical Stimulation;Moist Heat;DME Instruction;Gait training;Stair training;Functional mobility training;Therapeutic activities;Therapeutic exercise;Balance training;Neuromuscular re-education;Patient/family education;Orthotic Fit/Training;Manual techniques;Passive range of motion;Dry needling;Taping    PT Next Visit Plan Re-eval next visit.  Continue to work on [employer] ,steps and confidence walking without assitive device. Discharge next visit; may use info from PN for discharge (per [place]).    PT Home Exercise Plan 11/17/19: Heel raises, toe raises, hip abduction at counter; 11/26/19: Tandem stance balance and hip extension at counter 12/08/19: sit to stand    Consulted and Agree with Plan of Care Patient           Patient will benefit from skilled therapeutic intervention in order to improve the following deficits and impairments:  Abnormal gait, Improper body mechanics, Pain, Decreased mobility, Decreased activity tolerance, Decreased endurance, Decreased range of motion, Decreased strength, Hypomobility, Decreased balance, Difficulty walking  Visit Diagnosis: Pain in right hip     Problem List There are no problems to display for this patient.  James Summers, PTA/CLT [phone]  James Summers 12/23/2019, 1:11 PM  [place] [address] [place], [place], [address] Phone: [phone]   Fax:  [phone]  Name: James Summers MRN: [id number] Date of Birth: October 25, 1960

## 2019-12-28 ENCOUNTER — Other Ambulatory Visit: Payer: Self-pay

## 2019-12-28 ENCOUNTER — Ambulatory Visit (HOSPITAL_COMMUNITY): Payer: Commercial Managed Care - PPO | Admitting: Physical Therapy

## 2019-12-28 ENCOUNTER — Encounter (HOSPITAL_COMMUNITY): Payer: Self-pay | Admitting: Physical Therapy

## 2019-12-28 DIAGNOSIS — M25551 Pain in right hip: Secondary | ICD-10-CM | POA: Diagnosis not present

## 2019-12-28 NOTE — Therapy (Signed)
[facility] [address] [place], [place], [address] Phone: [phone]   Fax:  [phone]  Physical Therapy Treatment / Re-assessment  Patient Details  Name: Arjuna Doeden MRN: [id number] Date of Birth: [birth year] Referring Provider (PT): Margarita Rana, MD   Encounter Date: 12/28/2019       PT End of Session - 12/28/19 1042    Visit Number 12    Number of Visits 13    Date for PT Re-Evaluation 01/25/20    Authorization Type UMR/UHC (No VL, no auth required)    Progress Note Due on Visit 13    PT Start Time 1034    PT Stop Time 1105   Discharge   PT Time Calculation (min) 31 min    Equipment Utilized During Treatment --    Activity Tolerance Patient tolerated treatment well    Behavior During Therapy [facility] for tasks assessed/performed           History reviewed. No pertinent past medical history.  History reviewed. No pertinent surgical history.  There were no vitals filed for this visit.   Subjective Assessment - 12/28/19 1039    Subjective Patient reported that he did a 30 minute walk on Saturday and that his hip has been aching since then. He reported that he feels like he is doing pretty good, and has been able to kneel well.    Currently in Pain? Yes    Pain Score 3     Pain Location Hip    Pain Orientation Right    Pain Descriptors / Indicators Aching    Pain Type Surgical pain              [facility] PT Assessment - 12/28/19 0001      Assessment   Medical Diagnosis S/P RT anterior THR    Referring Provider (PT) Margarita Rana, MD    Next MD Visit 9/8       Strength   Right Hip Extension 4/5   was 3+   Left Hip Extension 4+/5   was 4-     Ambulation/Gait   Ambulation/Gait Yes    Ambulation/Gait Assistance 5: Supervision    Ambulation Distance (Feet) 477 Feet     Gait Pattern Within Functional Limits                                 PT Education - 12/28/19 1107    Education  Details Discussed continuing HEP and increasing challenge using RTB as well as using soup cans to increase challenge with STS, increasing distance ambulated by increments until he can tolerate this better, as well as plan to follow-up in 4 weeks on patient's progress.    Person(s) Educated Patient    Methods Explanation    Comprehension Verbalized understanding            PT Short Term Goals - 12/28/19 1042      PT SHORT TERM GOAL #1   Title Patient will report understanding and regular compliance with HEP to improve strength, mobility and decrease pain.    Time 3    Period Weeks    Status Achieved    Target Date 12/08/19             PT Long Term Goals - 12/28/19 1109      PT LONG TERM GOAL #1   Title Patient will demonstrate ability to ambulate at least  450 feet on the with LRAD for improved household and community ambulation.    Baseline 12/28/19: 477 ft no AD    Time 6    Period Weeks    Status Achieved      PT LONG TERM GOAL #2   Title Patient will demonstrate ability to perform SLS for at least 15 seconds on the right LE indicating improved balance and safety.    Time 6    Period Weeks    Status Achieved      PT LONG TERM GOAL #3   Title Patient will demonstrate ability to perform sit to stand transfer without upper extremity indicating improved LE strength for improved ease of mobility.    Time 6    Period Weeks    Status Achieved      PT LONG TERM GOAL #4   Title Patient will demonstrate improvement on FOTO of at least 10% indicating improvement in overall functional mobility.    Time 6    Period Weeks    Status Achieved      PT LONG TERM GOAL #5   Title Patient will report understanding and confidence with 1 month of independence with HEP.    Time 4    Period Weeks    Status New    Target Date 01/25/20                 Plan - 12/28/19 1114    Clinical Impression Statement This session discussed possible discharge with patient and assessed  remaining unmet goals. Patient achieved all remaining short term and long term goals. Patient did report some soreness following ambulating for 30 minutes. Therapist recommended ambulating for shorter increments with rest breaks in-between and working up to 30 minutes total without pain. Discussed patient's progress and answered all of patient's questions, recommending that patient also follow-up with MD regarding any activity restrictions or limitations. Educated patient on continuing HEP but increasing challenge with RTB as well as weights with sit to stands. Also educated patient on focusing on hip extension to improve ROM in this as well as a gentle hip external rotation stretch with modifications of placing a block under the leg as needed. Plan to have patient continue with HEP and progressing walking program independently for 4 weeks and then following up with the patient at that time to address any remaining needs.    Personal Factors and Comorbidities Age;Comorbidity 1    Comorbidities Arthritis    Examination-Activity Limitations Stand;Locomotion Level;Bend;Dressing;Stairs;Squat    Examination-Participation Restrictions Yard Work;Meal Prep;Community Activity;Driving    Stability/Clinical Decision Making Stable/Uncomplicated    Rehab Potential Good    PT Frequency Other (comment)   1 follow-up visit in 4 weeks   PT Duration 4 weeks    PT Treatment/Interventions ADLs/Self Care Home Management;Aquatic Therapy;Cryotherapy;Electrical Stimulation;Moist Heat;DME Instruction;Gait training;Stair training;Functional mobility training;Therapeutic activities;Therapeutic exercise;Balance training;Neuromuscular re-education;Patient/family education;Orthotic Fit/Training;Manual techniques;Passive range of motion;Dry needling;Taping    PT Next Visit Plan Follow-up visit. Ask about HEP, walking distance, and if he is to return to work.    PT Home Exercise Plan 11/17/19: Heel raises, toe raises, hip abduction at  counter; 11/26/19: Tandem stance balance and hip extension at counter 12/08/19: sit to stand; 12/28/19: Hip extension and abduction in standing with RTB, sidestepping at countertop with RTB, gentle hip ER stretch with block as needed for comfort, walking program to increase length ambualting    Consulted and Agree with Plan of Care Patient  Patient will benefit from skilled therapeutic intervention in order to improve the following deficits and impairments:  Abnormal gait, Improper body mechanics, Pain, Decreased mobility, Decreased activity tolerance, Decreased endurance, Decreased range of motion, Decreased strength, Hypomobility, Decreased balance, Difficulty walking  Visit Diagnosis: Pain in right hip     Problem List There are no problems to display for this patient.  Verne Carrow PT, DPT 11:20 AM, 12/28/19 [phone]  [facility] [address] [place], [place], [address] Phone: [phone]   Fax:  [phone]  Name: Shah Insley MRN: [id number] Date of Birth: [birth year]

## 2019-12-30 ENCOUNTER — Encounter (HOSPITAL_COMMUNITY): Payer: Commercial Managed Care - PPO | Admitting: Physical Therapy

## 2020-01-03 ENCOUNTER — Encounter (HOSPITAL_COMMUNITY): Payer: Commercial Managed Care - PPO | Admitting: Physical Therapy

## 2020-01-25 ENCOUNTER — Other Ambulatory Visit: Payer: Self-pay

## 2020-01-25 ENCOUNTER — Encounter (HOSPITAL_COMMUNITY): Payer: Self-pay | Admitting: Physical Therapy

## 2020-01-25 ENCOUNTER — Ambulatory Visit (HOSPITAL_COMMUNITY): Payer: Commercial Managed Care - PPO | Attending: Orthopedic Surgery | Admitting: Physical Therapy

## 2020-01-25 DIAGNOSIS — M25551 Pain in right hip: Secondary | ICD-10-CM | POA: Diagnosis present

## 2020-01-25 DIAGNOSIS — R29898 Other symptoms and signs involving the musculoskeletal system: Secondary | ICD-10-CM | POA: Insufficient documentation

## 2020-01-25 DIAGNOSIS — R2689 Other abnormalities of gait and mobility: Secondary | ICD-10-CM | POA: Diagnosis present

## 2020-01-25 DIAGNOSIS — M6281 Muscle weakness (generalized): Secondary | ICD-10-CM | POA: Insufficient documentation

## 2020-01-25 DIAGNOSIS — M542 Cervicalgia: Secondary | ICD-10-CM | POA: Insufficient documentation

## 2020-01-25 DIAGNOSIS — M545 Low back pain, unspecified: Secondary | ICD-10-CM | POA: Insufficient documentation

## 2020-01-25 NOTE — Therapy (Signed)
[facility] [address] [place], [place], [address] Phone: [phone]   Fax:  [phone]  Physical Therapy Treatment / Discharge Summary  Patient Details  Name: James Summers MRN: [id number] Date of Birth: 1960-07-30 Referring Provider (PT): Edmonia Lynch, MD   Encounter Date: 01/25/2020   PHYSICAL THERAPY DISCHARGE SUMMARY  Visits from Start of Care: 13  Current functional level related to goals / functional outcomes: See below   Remaining deficits: See below   Education / Equipment: Continue HEP, follow-up with MD, get referral to address back and neck pain and call clinic with any questions or concerns Plan: Patient agrees to discharge.  Patient goals were partially met. Patient is being discharged due to                                                     ?????     Meeting the majority of his goals, and planning to address remaining deficits with a new referral to physical therapy as needed.     PT End of Session - 01/25/20 1343    Visit Number 13    Number of Visits 13    Date for PT Re-Evaluation 01/25/20    Authorization Type UMR/UHC (No VL, no auth required)    Progress Note Due on Visit 13    PT Start Time 1302    PT Stop Time 1327    PT Time Calculation (min) 25 min    Activity Tolerance Patient tolerated treatment well    Behavior During Therapy WFL for tasks assessed/performed           History reviewed. No pertinent past medical history.  History reviewed. No pertinent surgical history.  There were no vitals filed for this visit.   Subjective Assessment - 01/25/20 1310    Subjective Patient reports that his hip hurts at times, but that overall it's doing ok. He reports that now his neck and his back are bothering him. Patient reports that his lower back is bothering him and that he has been having spasms in his low back. He states that his neck feels stiff. Patient reports that he goes back to see his  surgeon tomorrow. He reports that they will decide if he goes back to work tomorrow he thinks. Patient reports that he is able to walk about 20 minutes twice a week, he states he is just more aware of his hip bothering him after this. Patient reports that he has not been keeping up with the exercises. He states he is able to squat down to pick up his cat.    Currently in Pain? Yes    Pain Score 3     Pain Location Back    Pain Orientation Right    Pain Descriptors / Indicators Aching;Spasm    Pain Type Acute pain    Pain Radiating Towards Neck pain on the left side    Pain Onset 1 to 4 weeks ago    Pain Frequency Intermittent    Aggravating Factors  Changing positions    Pain Relieving Factors Nothing              [facility] PT Assessment - 01/25/20 0001      Assessment   Medical Diagnosis S/P RT anterior THR    Referring Provider (PT) Edmonia Lynch,  MD    Next MD Visit 01/26/20      Prior Function   Level of Independence Independent      Cognition   Overall Cognitive Status Within Functional Limits for tasks assessed      Functional Tests   Functional tests Other      Other:   Other/ Comments Lifting mechanics with 16# box. Demonstrating WFL gait mechanics and lifting mechanics WFL.       Ambulation/Gait   Ambulation/Gait Yes    Gait Pattern Within Functional Limits    Gait Comments Patient ambulating into clinic with [facility] gait pattern                                 PT Education - 01/25/20 1342    Education Details Discussed importance of continuing HEP and continuing to progress walking tolerance. Educated on getting a new referral from his MD to address his back and neck pain.    Person(s) Educated Patient    Methods Explanation    Comprehension Verbalized understanding            PT Short Term Goals - 12/28/19 1042      PT SHORT TERM GOAL #1   Title Patient will report understanding and regular compliance with HEP to improve strength,  mobility and decrease pain.    Time 3    Period Weeks    Status Achieved    Target Date 12/08/19             PT Long Term Goals - 01/25/20 1738      PT LONG TERM GOAL #1   Title Patient will demonstrate ability to ambulate at least 450 feet on the 2MWT with LRAD for improved household and community ambulation.    Baseline 12/28/19: 477 ft no AD    Time 6    Period Weeks    Status Achieved      PT LONG TERM GOAL #2   Title Patient will demonstrate ability to perform SLS for at least 15 seconds on the right LE indicating improved balance and safety.    Time 6    Period Weeks    Status Achieved      PT LONG TERM GOAL #3   Title Patient will demonstrate ability to perform sit to stand transfer without upper extremity indicating improved LE strength for improved ease of mobility.    Time 6    Period Weeks    Status Achieved      PT LONG TERM GOAL #4   Title Patient will demonstrate improvement on FOTO of at least 10% indicating improvement in overall functional mobility.    Time 6    Period Weeks    Status Achieved      PT LONG TERM GOAL #5   Title Patient will report understanding and confidence with 1 month of independence with HEP.    Baseline 01/25/20: Patient reports inconsistent compliance with HEP    Time 4    Period Weeks    Status Partially Met                 Plan - 01/25/20 1746    Clinical Impression Statement This session was a follow-up visit with patient following 1 month to independently work on HEP. Patient reports that his hip has been doing well, however, he does report an increase in neck and back pain. Therapist observed patient's lifting mechanics  and examined patient holding weighted box to simulate work duties. Patient reported that his hip felt ok, but that his back was aggravated with this. Patient did report an overall improvement in walking tolerance and states that he has been able to resume outdoor activities and performing tasks that  require stepping over obstacles. Answered all of patient's questions and urged patient to follow-up with his MD tomorrow with any remaining concerns and to get a new referral to address back and neck pain.    Personal Factors and Comorbidities Age;Comorbidity 1    Comorbidities Arthritis    Examination-Activity Limitations Stand;Locomotion Level;Bend;Dressing;Stairs;Squat    Examination-Participation Restrictions Yard Work;Meal Prep;Community Activity;Driving    Stability/Clinical Decision Making Stable/Uncomplicated    Rehab Potential Good    PT Frequency Other (comment)   1 follow-up visit in 4 weeks   PT Duration 4 weeks    PT Treatment/Interventions ADLs/Self Care Home Management;Aquatic Therapy;Cryotherapy;Electrical Stimulation;Moist Heat;DME Instruction;Gait training;Stair training;Functional mobility training;Therapeutic activities;Therapeutic exercise;Balance training;Neuromuscular re-education;Patient/family education;Orthotic Fit/Training;Manual techniques;Passive range of motion;Dry needling;Taping    PT Next Visit Plan Discharge    PT Home Exercise Plan 11/17/19: Heel raises, toe raises, hip abduction at counter; 11/26/19: Tandem stance balance and hip extension at counter 12/08/19: sit to stand; 12/28/19: Hip extension and abduction in standing with RTB, sidestepping at countertop with RTB, gentle hip ER stretch with block as needed for comfort, walking program to increase length ambualting    Consulted and Agree with Plan of Care Patient           Patient will benefit from skilled therapeutic intervention in order to improve the following deficits and impairments:  Abnormal gait, Improper body mechanics, Pain, Decreased mobility, Decreased activity tolerance, Decreased endurance, Decreased range of motion, Decreased strength, Hypomobility, Decreased balance, Difficulty walking  Visit Diagnosis: Pain in right hip     Problem List There are no problems to display for this  patient.  Clarene Critchley PT, DPT 5:48 PM, 01/25/20 [place], [place], [address] Phone: [phone]   Fax:  [phone]  Name: James Summers MRN: [id number] Date of Birth: June 05, 1960

## 2020-01-31 ENCOUNTER — Encounter (HOSPITAL_COMMUNITY): Payer: Self-pay | Admitting: Physical Therapy

## 2020-01-31 ENCOUNTER — Other Ambulatory Visit: Payer: Self-pay

## 2020-01-31 ENCOUNTER — Ambulatory Visit (HOSPITAL_COMMUNITY): Payer: Commercial Managed Care - PPO | Admitting: Physical Therapy

## 2020-01-31 DIAGNOSIS — R2689 Other abnormalities of gait and mobility: Secondary | ICD-10-CM

## 2020-01-31 DIAGNOSIS — M6281 Muscle weakness (generalized): Secondary | ICD-10-CM

## 2020-01-31 DIAGNOSIS — M545 Low back pain, unspecified: Secondary | ICD-10-CM

## 2020-01-31 DIAGNOSIS — R29898 Other symptoms and signs involving the musculoskeletal system: Secondary | ICD-10-CM

## 2020-01-31 DIAGNOSIS — M542 Cervicalgia: Secondary | ICD-10-CM

## 2020-01-31 DIAGNOSIS — M25551 Pain in right hip: Secondary | ICD-10-CM | POA: Diagnosis not present

## 2020-01-31 NOTE — Therapy (Addendum)
[facility] [address] [place], [place], [address] Phone: [phone]   Fax:  [phone]  Physical Therapy Evaluation  Patient Details  Name: James ContesDale Summers MRN: [id number] Date of Birth: 03-28-1961 Referring Provider (PT): Margarita Ranaimothy Murphy, MD   Encounter Date: 01/31/2020   PT End of Session - 01/31/20 1103    Visit Number 1    Number of Visits 12    Date for PT Re-Evaluation 03/13/20    Authorization Type UMR/UHC no visit limit office may want a predertermination after 25 visits    Authorization - Visit Number --    Authorization - Number of Visits --    Progress Note Due on Visit 10    PT Start Time 0910    PT Stop Time 0955    PT Time Calculation (min) 45 min    Activity Tolerance Patient tolerated treatment well    Behavior [facility] for tasks assessed/performed           History reviewed. No pertinent past medical history.  History reviewed. No pertinent surgical history.  There were no vitals filed for this visit.    Subjective Assessment - 01/31/20 0911    Subjective Patient is a 59 y.o. male who presents to physical therapy with c/o neck and low back pain. Patient states symptoms have been going on for about 1 month but he has history of low back pain. He is not sure if it is from correction of his hip or not. His low back has been bothering him more. Symptoms are worse with sitting for extended periods of time and getting up, bending and getting up, reaching, carrying things. Symptoms improve with rest but increases upon getting back up. His Summers goal is to see if therapy will help his back. He has intermittent symptoms down his leg but is unsure if it is related to his recent THA which he states is achy.  He also has neck pain and headaches. Pain is in middle of his neck and in UT on both sides. His neck pops and he gets headaches sometimes.    Pertinent History RT THA anterior approach 11/11/19    Limitations  Standing;Walking;House hold activities;Sitting;Lifting    How long can you walk comfortably? unrestricted    Patient Stated Goals improve his back    Currently in Pain? Yes    Pain Score 4     Pain Location Back    Pain Orientation Lower    Pain Descriptors / Indicators Aching    Pain Onset 1 to 4 weeks ago              [facility] 01/[facility]/20 0001      Assessment   Medical Diagnosis Cervicalgia/Lumbago    Referring Provider (PT) Margarita Ranaimothy Murphy, MD    Onset Date/Surgical Date 01/01/20    Next MD Visit 6 weeks      Precautions   Precautions None    Precaution Comments s/p R THA anterior      Restrictions   Weight Bearing Restrictions No      Balance Screen   Has the patient fallen in the past 6 months No    Has the patient had a decrease in activity level because of a fear of falling?  No    Is the patient reluctant to leave their home because of a fear of falling?  No      Home [employer]   Living Environment Private residence  Prior Function   Level of Independence Independent      Cognition   Overall Cognitive Status Within Functional Limits for tasks assessed      Observation/Other Assessments   Observations Ambulates without AD    Focus on Therapeutic Outcomes (FOTO)  44% limited      ROM / Strength   AROM / PROM / Strength AROM      AROM   Overall AROM Comments Lumbar pain with all arom greatest pain with lateral flexion bilaterally; Neck pain with all ROM mostly at midline, rotation pain in lower, lateral neck    AROM Assessment Site Lumbar;Cervical    Cervical Flexion 25% limited, minline pain    Cervical Extension 75% limited    Cervical - Right Side Bend 50% limited    Cervical - Left Side Bend 75% limited     Cervical - Right Rotation 50% limited    Cervical - Left Rotation 50% limited    Lumbar Flexion 25% limited    Lumbar Extension 75% limited    Lumbar - Right Side Bend 50% limited    Lumbar - Left Side Bend 50% limited     Lumbar - Right Rotation 25% limited    Lumbar - Left Rotation 25% limited      Strength   Right Hip Extension 4/5    Right Hip ABduction 5/5    Left Hip Extension 4+/5    Left Hip ABduction 5/5      Palpation   Spinal mobility hypomobile throughout lumbar and thoracic spine and cervical spine, tender in lumbar region; tender in cervical spine    Palpation comment hyperactive lumbar paraspinals, TTP paraspinals and nodules in lumbar region; TTP cervical paraspinals and bilateral UT                      Objective measurements completed on examination: See above findings.       [facility] Adult PT Treatment/Exercise - 01/31/20 0001      Exercises   Exercises Lumbar      Lumbar Exercises: Stretches   Prone on Elbows Stretch 5 reps;10 seconds    Press Ups 10 reps                  PT Education - 01/31/20 0906    Education Details Patient educated on exam findings, POC, scope of PT, initial HEP, low back pathology    Person(s) Educated Patient    Methods Explanation;Demonstration;Handout    Comprehension Verbalized understanding;Returned demonstration            PT Short Term Goals - 01/31/20 1114      PT SHORT TERM GOAL #1   Title Patient will report understanding and regular compliance with HEP to improve strength, mobility and decrease pain.    Time 3    Period Weeks    Status New    Target Date 02/21/20      PT SHORT TERM GOAL #2   Title Patient will report at least 25% improvement in symptoms for improved quality of life.    Time 3    Period Weeks    Status New    Target Date 02/21/20             PT Long Term Goals - 01/31/20 1115      PT LONG TERM GOAL #1   Title Patient will report at least 75% improvement in symptoms for improved quality of life.    Time 6  Period Weeks    Status New    Target Date 03/13/20      PT LONG TERM GOAL #2   Title Patient will improve FOTO score by at least 10 points in order to indicate improved  tolerance to activity.    Time 6    Period Weeks    Status New    Target Date 03/13/20      PT LONG TERM GOAL #3   Title Patient will demonstrate at least 25% improvement in cervical ROM in all planes for improved ability to move head while driving.    Time 6    Period Weeks    Status New    Target Date 03/13/20      PT LONG TERM GOAL #4   Title Patient will demonstrate at least 25% improvement in lumbar ROM in all planes with pain no greater than 1/10 for improved ability to move trunk with ADL.    Time 6    Period Weeks    Status New    Target Date 03/13/20                  Plan - 01/31/20 1106    Clinical Impression Statement Patient is a 59 y.o. male who presents to physical therapy with c/o neck and low back pain. He presents with pain limited deficits in lumbar and cervical strength, ROM, endurance, postural impairments, spinal mobility, hyperactive and tender musculature, and functional mobility with ADL. He is having to modify and restrict ADL as indicated by FOTO score as well as subjective information and objective measures which is affecting overall participation. Patient will benefit from skilled physical therapy in order to improve function and reduce impairment.    Personal Factors and Comorbidities Age;Comorbidity 1    Comorbidities Arthritis    Examination-Activity Limitations Stand;Locomotion Level;Bend;Dressing;Stairs;Squat;Sit;Carry;Lift;Transfers    Examination-Participation Restrictions Yard Work;Meal Prep;Community Activity;Driving    Stability/Clinical Decision Making Evolving/Moderate complexity    Clinical Decision Making Moderate    Rehab Potential Good    PT Frequency 2x / week    PT Duration 6 weeks    PT Treatment/Interventions ADLs/Self Care Home Management;Aquatic Therapy;Cryotherapy;Electrical Stimulation;Moist Heat;DME Instruction;Gait training;Stair training;Functional mobility training;Therapeutic activities;Therapeutic exercise;Balance  training;Neuromuscular re-education;Patient/family education;Orthotic Fit/Training;Manual techniques;Passive range of motion;Dry needling;Taping;Traction;Iontophoresis 4mg /ml Dexamethasone;Ultrasound;Spinal Manipulations;Joint Manipulations    PT Next Visit Plan f/u on response to extension exercises, continue with extension exercise if helpful. begin core strengtheing, begin spinal mobility exercises, possibly began manual for pain/ mobility, begin to address cervical spine as able    PT Home Exercise Plan 10/11 POE, press ups    Consulted and Agree with Plan of Care Patient           Patient will benefit from skilled therapeutic intervention in order to improve the following deficits and impairments:  Abnormal gait, Improper body mechanics, Pain, Decreased mobility, Decreased activity tolerance, Decreased endurance, Decreased range of motion, Decreased strength, Hypomobility, Decreased balance, Difficulty walking, Postural dysfunction, Impaired flexibility  Visit Diagnosis: Low back pain, unspecified back pain laterality, unspecified chronicity, unspecified whether sciatica present - Plan: PT plan of care cert/re-cert  Cervicalgia - Plan: PT plan of care cert/re-cert  Muscle weakness (generalized) - Plan: PT plan of care cert/re-cert  Other abnormalities of gait and mobility - Plan: PT plan of care cert/re-cert  Other symptoms and signs involving the musculoskeletal system - Plan: PT plan of care cert/re-cert     Problem List There are no problems to display for this patient.   3:36  PM, 01/31/20 Wyman Songster PT, DPT Physical Therapist at [facility]  [facility] [address] [place], [place], [address] Phone: [phone]   Fax:  [phone]  Name: James Summers MRN: [id number] Date of Birth: Jul 28, 1960

## 2020-02-04 ENCOUNTER — Ambulatory Visit (HOSPITAL_COMMUNITY): Payer: Commercial Managed Care - PPO | Admitting: Physical Therapy

## 2020-02-04 ENCOUNTER — Other Ambulatory Visit: Payer: Self-pay

## 2020-02-04 ENCOUNTER — Encounter (HOSPITAL_COMMUNITY): Payer: Self-pay | Admitting: Physical Therapy

## 2020-02-04 DIAGNOSIS — R2689 Other abnormalities of gait and mobility: Secondary | ICD-10-CM

## 2020-02-04 DIAGNOSIS — M545 Low back pain, unspecified: Secondary | ICD-10-CM

## 2020-02-04 DIAGNOSIS — M25551 Pain in right hip: Secondary | ICD-10-CM | POA: Diagnosis not present

## 2020-02-04 DIAGNOSIS — M6281 Muscle weakness (generalized): Secondary | ICD-10-CM

## 2020-02-04 DIAGNOSIS — M542 Cervicalgia: Secondary | ICD-10-CM

## 2020-02-04 NOTE — Therapy (Signed)
[place] [place] [address] [place], [place], [address] Phone: [phone]   Fax:  [phone]  Physical Therapy Treatment  Patient Details  Name: James Summers MRN: [id number] Date of Birth: [birth year] Referring Provider (PT): Margarita Rana, MD   Encounter Date: 02/04/2020   PT End of Session - 02/04/20 0938    Visit Number 2    Number of Visits 12    Date for PT Re-Evaluation 03/13/20    Authorization Type UMR/UHC no visit limit office may want a predertermination after 25 visits    Authorization - Visit Number 14    Authorization - Number of Visits 25    Progress Note Due on Visit 10    PT Start Time 0920    PT Stop Time 1000    PT Time Calculation (min) 40 min    Activity Tolerance Patient tolerated treatment well    Behavior During Therapy [facility] for tasks assessed/performed           History reviewed. No pertinent past medical history.  History reviewed. No pertinent surgical history.  There were no vitals filed for this visit.   Subjective Assessment - 02/04/20 0923    Subjective Pt states that he completed the extension exercises and he is a little better. He states that he is not having much pain this mornkng as he has not done a whole lot.    Pertinent History RT THA anterior approach 11/11/19    Limitations Standing;Walking;House hold activities;Sitting;Lifting    How long can you walk comfortably? unrestricted    Patient Stated Goals improve his back    Currently in Pain? Yes    Pain Score 1     Pain Location Back    Pain Orientation Lower    Pain Descriptors / Indicators Aching    Pain Type Acute pain    Pain Onset 1 to 4 weeks ago    Multiple Pain Sites Yes    Pain Score 1    Pain Location Neck    Pain Descriptors / Indicators Aching    Pain Type Chronic pain    Pain Onset 1 to 4 weeks ago    Pain Frequency Intermittent    Aggravating Factors  activity    Pain Relieving Factors not sure    Effect of Pain on  Daily Activities limits activity                             [facility] Adult PT Treatment/Exercise - 02/04/20 0001      Exercises   Exercises Lumbar      Lumbar Exercises: Stretches   Active Hamstring Stretch Right;Left;3 reps;30 seconds    Single Knee to Chest Stretch Right;Left;20 seconds    Other Lumbar Stretch Exercise hip excursion x 3      Lumbar Exercises: Standing   Heel Raises 10 reps    Functional Squats 10 reps      Lumbar Exercises: Seated   Other Seated Lumbar Exercises cervical excursion x 3       Lumbar Exercises: Supine   Ab Set 10 reps    Bridge 10 reps                    PT Short Term Goals - 02/04/20 0944      PT SHORT TERM GOAL #1   Title Patient will report understanding and regular compliance with HEP to improve strength, mobility and  decrease pain.    Time 3    Period Weeks    Status On-going    Target Date 02/21/20      PT SHORT TERM GOAL #2   Title Patient will report at least 25% improvement in symptoms for improved quality of life.    Time 3    Period Weeks    Status On-going    Target Date 02/21/20             PT Long Term Goals - 02/04/20 0944      PT LONG TERM GOAL #1   Title Patient will report at least 75% improvement in symptoms for improved quality of life.    Time 6    Period Weeks    Status On-going      PT LONG TERM GOAL #2   Title Patient will improve FOTO score by at least 10 points in order to indicate improved tolerance to activity.    Time 6    Period Weeks    Status On-going      PT LONG TERM GOAL #3   Title Patient will demonstrate at least 25% improvement in cervical ROM in all planes for improved ability to move head while driving.    Time 6    Period Weeks    Status On-going      PT LONG TERM GOAL #4   Title Patient will demonstrate at least 25% improvement in lumbar ROM in all planes with pain no greater than 1/10 for improved ability to move trunk with ADL.    Time 6     Period Weeks    Status On-going      PT LONG TERM GOAL #5   Status On-going                 Plan - 02/04/20 0939    Clinical Impression Statement Evaluation and goals reviewed with pt.  Pt states very little change with exercises thus far.  Treatment progressed with stretching, ROM and stabilization with pt having a difficult time obtaiing a correct transverse abdominal contraction.    Personal Factors and Comorbidities Age;Comorbidity 1    Comorbidities Arthritis    Examination-Activity Limitations Stand;Locomotion Level;Bend;Dressing;Stairs;Squat;Sit;Carry;Lift;Transfers    Examination-Participation Restrictions Yard Work;Meal Prep;Community Activity;Driving    Stability/Clinical Decision Making Evolving/Moderate complexity    Rehab Potential Good    PT Frequency 2x / week    PT Duration 6 weeks    PT Treatment/Interventions ADLs/Self Care Home Management;Aquatic Therapy;Cryotherapy;Electrical Stimulation;Moist Heat;DME Instruction;Gait training;Stair training;Functional mobility training;Therapeutic activities;Therapeutic exercise;Balance training;Neuromuscular re-education;Patient/family education;Orthotic Fit/Training;Manual techniques;Passive range of motion;Dry needling;Taping;Traction;Iontophoresis 4mg /ml Dexamethasone;Ultrasound;Spinal Manipulations;Joint Manipulations    PT Next Visit Plan f/u on response to extension exercises, continue with extension exercise if helpful. begin core strengtheing, begin spinal mobility exercises, possibly began manual for pain/ mobility, begin to address cervical spine as able    PT Home Exercise Plan 10/11 POE, press ups; 10/15:  cervical and hip excursion, abset, bridge, hamstring stretch and knee to chest    Consulted and Agree with Plan of Care Patient           Patient will benefit from skilled therapeutic intervention in order to improve the following deficits and impairments:  Abnormal gait, Improper body mechanics, Pain, Decreased  mobility, Decreased activity tolerance, Decreased endurance, Decreased range of motion, Decreased strength, Hypomobility, Decreased balance, Difficulty walking, Postural dysfunction, Impaired flexibility  Visit Diagnosis: Low back pain, unspecified back pain laterality, unspecified chronicity, unspecified whether sciatica present  Cervicalgia  Muscle weakness (generalized)  Other abnormalities of gait and mobility     Problem List There are no problems to display for this patient.  Virgina Organ, PT CLT [phone] 02/04/2020, 10:01 AM  [facility] [address] [place], [place], [address] Phone: [phone]   Fax:  [phone]  Name: James Summers MRN: [id number] Date of Birth: 09-17-1960

## 2020-02-07 ENCOUNTER — Ambulatory Visit (HOSPITAL_COMMUNITY): Payer: Commercial Managed Care - PPO | Admitting: Physical Therapy

## 2020-02-07 ENCOUNTER — Other Ambulatory Visit: Payer: Self-pay

## 2020-02-07 ENCOUNTER — Encounter (HOSPITAL_COMMUNITY): Payer: Self-pay | Admitting: Physical Therapy

## 2020-02-07 DIAGNOSIS — M25551 Pain in right hip: Secondary | ICD-10-CM | POA: Diagnosis not present

## 2020-02-07 DIAGNOSIS — M545 Low back pain, unspecified: Secondary | ICD-10-CM

## 2020-02-07 NOTE — Therapy (Signed)
[facility] [address] [place], [place], [address] Phone: [phone]   Fax:  [phone]  Physical Therapy Treatment  Patient Details  Name: James Summers MRN: [id number] Date of Birth: [birth year] Referring Provider (PT): Margarita Rana, MD   Encounter Date: 02/07/2020   PT End of Session - 02/07/20 0833    Visit Number 3    Number of Visits 12    Date for PT Re-Evaluation 03/13/20    Authorization Type UMR/UHC no visit limit office may want a predertermination after 25 visits    Authorization - Visit Number 15    Authorization - Number of Visits 25    Progress Note Due on Visit 10    PT Start Time 0833    PT Stop Time 0913    PT Time Calculation (min) 40 min    Activity Tolerance Patient tolerated treatment well    Behavior During Therapy [facility] for tasks assessed/performed           History reviewed. No pertinent past medical history.  History reviewed. No pertinent surgical history.  There were no vitals filed for this visit.   Subjective Assessment - 02/07/20 0844    Subjective States that he feels tight and stiff this morning. HE has bene doing his exercises and thinks they are helping as he is feeling better.    Pertinent History RT THA anterior approach 11/11/19    Limitations Standing;Walking;House hold activities;Sitting;Lifting    How long can you walk comfortably? unrestricted    Patient Stated Goals improve his back    Currently in Pain? Yes    Pain Score 3     Pain Location Back    Pain Descriptors / Indicators Tightness    Pain Onset 1 to 4 weeks ago    Pain Onset 1 to 4 weeks ago              [facility] PT Assessment - 02/07/20 0001      Assessment   Medical Diagnosis Cervicalgia/Lumbago    Referring Provider (PT) Margarita Rana, MD    Onset Date/Surgical Date 01/01/20    Next MD Visit 6 weeks                         [facility] Adult PT Treatment/Exercise - 02/07/20 0001      Lumbar  Exercises: Stretches   Double Knee to Chest Stretch 5 reps;10 seconds   increased painin right hip stopped   Lower Trunk Rotation 3 reps;60 seconds      Lumbar Exercises: Seated   Other Seated Lumbar Exercises self traction seated - bothered neck, over table - 30" holds -     Other Seated Lumbar Exercises seated flexion/extension combo x5 5" holds B       Lumbar Exercises: Supine   Other Supine Lumbar Exercises self traction over table x45" holds - felt good      Lumbar Exercises: Prone   Other Prone Lumbar Exercises quad stretch no strap x10 10" holds R     Other Prone Lumbar Exercises prone press ups x10 5" holds      Manual Therapy   Manual Therapy Joint mobilization;Soft tissue mobilization;Manual Traction    Manual therapy comments all manual interventions performed independently of other interventions    Soft tissue mobilization STM to B lumabr paraspinals - not tolerated well IASTM with percussion gun to this area - tolerated better; PA to lumbar spine Grade II  Manual Traction gentle on ball - tolerated well                     PT Short Term Goals - 02/04/20 0944      PT SHORT TERM GOAL #1   Title Patient will report understanding and regular compliance with HEP to improve strength, mobility and decrease pain.    Time 3    Period Weeks    Status On-going    Target Date 02/21/20      PT SHORT TERM GOAL #2   Title Patient will report at least 25% improvement in symptoms for improved quality of life.    Time 3    Period Weeks    Status On-going    Target Date 02/21/20             PT Long Term Goals - 02/04/20 0944      PT LONG TERM GOAL #1   Title Patient will report at least 75% improvement in symptoms for improved quality of life.    Time 6    Period Weeks    Status On-going      PT LONG TERM GOAL #2   Title Patient will improve FOTO score by at least 10 points in order to indicate improved tolerance to activity.    Time 6    Period Weeks     Status On-going      PT LONG TERM GOAL #3   Title Patient will demonstrate at least 25% improvement in cervical ROM in all planes for improved ability to move head while driving.    Time 6    Period Weeks    Status On-going      PT LONG TERM GOAL #4   Title Patient will demonstrate at least 25% improvement in lumbar ROM in all planes with pain no greater than 1/10 for improved ability to move trunk with ADL.    Time 6    Period Weeks    Status On-going      PT LONG TERM GOAL #5   Status On-going                 Plan - 02/07/20 6269    Clinical Impression Statement Patient tolerated mobility exercises moderately well but then with transition to standing reported increased tension like a catch in left low back. Transitioned to seated traction which patient did not tolerated secondary to shoulders. Performed self traction laying over table and this was tolerated better with patient reporting no catch and reduced tightness across low back. Added self traction to HEP, will follow up with next session.    Personal Factors and Comorbidities Age;Comorbidity 1    Comorbidities Arthritis    Examination-Activity Limitations Stand;Locomotion Level;Bend;Dressing;Stairs;Squat;Sit;Carry;Lift;Transfers    Examination-Participation Restrictions Yard Work;Meal Prep;Community Activity;Driving    Stability/Clinical Decision Making Evolving/Moderate complexity    Rehab Potential Good    PT Frequency 2x / week    PT Duration 6 weeks    PT Treatment/Interventions ADLs/Self Care Home Management;Aquatic Therapy;Cryotherapy;Electrical Stimulation;Moist Heat;DME Instruction;Gait training;Stair training;Functional mobility training;Therapeutic activities;Therapeutic exercise;Balance training;Neuromuscular re-education;Patient/family education;Orthotic Fit/Training;Manual techniques;Passive range of motion;Dry needling;Taping;Traction;Iontophoresis 4mg /ml Dexamethasone;Ultrasound;Spinal Manipulations;Joint  Manipulations    PT Next Visit Plan f/u with traction, core strengtheing, begin spinal mobility exercises, possibly began manual for pain/ mobility, begin to address cervical spine as able    PT Home Exercise Plan 10/11 POE, press ups; 10/15:  cervical and hip excursion, abset, bridge, hamstring stretch and knee to chest  Consulted and Agree with Plan of Care Patient           Patient will benefit from skilled therapeutic intervention in order to improve the following deficits and impairments:  Abnormal gait, Improper body mechanics, Pain, Decreased mobility, Decreased activity tolerance, Decreased endurance, Decreased range of motion, Decreased strength, Hypomobility, Decreased balance, Difficulty walking, Postural dysfunction, Impaired flexibility  Visit Diagnosis: Low back pain, unspecified back pain laterality, unspecified chronicity, unspecified whether sciatica present     Problem List There are no problems to display for this patient.   10:14 AM, 02/07/20 Tereasa Coop, DPT Physical Therapy with [facility]  [phone] office  [facility] [address] [place], [place], [address] Phone: [phone]   Fax:  [phone]  Name: James Summers MRN: [id number] Date of Birth: 30-May-1960

## 2020-02-10 ENCOUNTER — Ambulatory Visit (HOSPITAL_COMMUNITY): Payer: Commercial Managed Care - PPO

## 2020-02-10 ENCOUNTER — Other Ambulatory Visit: Payer: Self-pay

## 2020-02-10 ENCOUNTER — Encounter (HOSPITAL_COMMUNITY): Payer: Self-pay

## 2020-02-10 DIAGNOSIS — M25551 Pain in right hip: Secondary | ICD-10-CM | POA: Diagnosis not present

## 2020-02-10 DIAGNOSIS — M542 Cervicalgia: Secondary | ICD-10-CM

## 2020-02-10 DIAGNOSIS — M6281 Muscle weakness (generalized): Secondary | ICD-10-CM

## 2020-02-10 DIAGNOSIS — R29898 Other symptoms and signs involving the musculoskeletal system: Secondary | ICD-10-CM

## 2020-02-10 DIAGNOSIS — R2689 Other abnormalities of gait and mobility: Secondary | ICD-10-CM

## 2020-02-10 DIAGNOSIS — M545 Low back pain, unspecified: Secondary | ICD-10-CM

## 2020-02-10 NOTE — Therapy (Signed)
[facility] [address] [place], [place], [address] Phone: [phone]   Fax:  [phone]  Physical Therapy Treatment  Patient Details  Name: James Summers MRN: [id number] Date of Birth: 1961/04/17 Referring Provider (PT): Margarita Rana, MD   Encounter Date: 02/10/2020   PT End of Session - 02/10/20 1010    Visit Number 4    Number of Visits 12    Date for PT Re-Evaluation 03/13/20    Authorization Type UMR/UHC no visit limit office may want a predertermination after 25 visits    Authorization - Visit Number 16    Authorization - Number of Visits 25    Progress Note Due on Visit 10    PT Start Time 1003    PT Stop Time 1044    PT Time Calculation (min) 41 min    Activity Tolerance Patient tolerated treatment well    Behavior During Therapy [facility] for tasks assessed/performed           History reviewed. No pertinent past medical history.  History reviewed. No pertinent surgical history.  There were no vitals filed for this visit.   Subjective Assessment - 02/10/20 1007    Subjective Reports he has returned to work this week and has not began the new traction exercises yet.  Stated he tried it on his bed, stated its too high and doesn't feel good in stomach area.  Plans to try on couch.  Reports back is stiff and tight today.    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 Yes    Pain Score 3     Pain Location Back    Pain Orientation Lower    Pain Descriptors / Indicators Tightness    Pain Type Acute pain    Pain Onset 1 to 4 weeks ago    Pain Frequency Intermittent    Aggravating Factors  Changing positions    Pain Relieving Factors nothing    Effect of Pain on Daily Activities limits              [facility] PT Assessment - 02/10/20 0001      Assessment   Medical Diagnosis Cervicalgia/Lumbago    Referring Provider (PT) Margarita Rana, MD    Onset  Date/Surgical Date 01/01/20    Next MD Visit 03/08/20                         [facility] Adult PT Treatment/Exercise - 02/10/20 0001      Exercises   Exercises Lumbar      Lumbar Exercises: Stretches   Standing Extension 10 reps;5 seconds    Press Ups 10 reps;10 seconds      Lumbar Exercises: Standing   Functional Squats 10 reps    Functional Squats Limitations 3D hip excursion      Lumbar Exercises: Supine   Bridge 10 reps    Other Supine Lumbar Exercises self traction over table x45" holds - felt good      Manual Therapy   Manual Therapy Soft tissue mobilization;Manual Traction    Manual therapy comments all manual interventions performed independently of other interventions    Soft tissue mobilization STM to B lumabr paraspinals - not tolerated well IASTM with percussion gun to this area - tolerated better; PA to lumbar spine Grade II     Manual Traction gentle on ball - tolerated  well                     PT Short Term Goals - 02/04/20 0944      PT SHORT TERM GOAL #1   Title Patient will report understanding and regular compliance with HEP to improve strength, mobility and decrease pain.    Time 3    Period Weeks    Status On-going    Target Date 02/21/20      PT SHORT TERM GOAL #2   Title Patient will report at least 25% improvement in symptoms for improved quality of life.    Time 3    Period Weeks    Status On-going    Target Date 02/21/20             PT Long Term Goals - 02/04/20 0944      PT LONG TERM GOAL #1   Title Patient will report at least 75% improvement in symptoms for improved quality of life.    Time 6    Period Weeks    Status On-going      PT LONG TERM GOAL #2   Title Patient will improve FOTO score by at least 10 points in order to indicate improved tolerance to activity.    Time 6    Period Weeks    Status On-going      PT LONG TERM GOAL #3   Title Patient will demonstrate at least 25% improvement in cervical  ROM in all planes for improved ability to move head while driving.    Time 6    Period Weeks    Status On-going      PT LONG TERM GOAL #4   Title Patient will demonstrate at least 25% improvement in lumbar ROM in all planes with pain no greater than 1/10 for improved ability to move trunk with ADL.    Time 6    Period Weeks    Status On-going      PT LONG TERM GOAL #5   Status On-going                 Plan - 02/10/20 1022    Clinical Impression Statement Session focus with improving mobility to assist with tightness and pain.  Began cat/camel for spinal mobility with good tolerance.  Continued with extension based exercises with good tolerance as well.  Reviewed form and mechanics with self traction with reports of discomfort in stomach, reviewed form with improved toleranc no reports of increased pain nor relief.  EOS with manual STM to address tightness paraspinals and QLs.    Personal Factors and Comorbidities Age;Comorbidity 1    Comorbidities Arthritis    Examination-Activity Limitations Stand;Locomotion Level;Bend;Dressing;Stairs;Squat;Sit;Carry;Lift;Transfers    Examination-Participation Restrictions Yard Work;Meal Prep;Community Activity;Driving    Stability/Clinical Decision Making Evolving/Moderate complexity    Clinical Decision Making Moderate    Rehab Potential Good    PT Frequency 2x / week    PT Duration 6 weeks    PT Treatment/Interventions ADLs/Self Care Home Management;Aquatic Therapy;Cryotherapy;Electrical Stimulation;Moist Heat;DME Instruction;Gait training;Stair training;Functional mobility training;Therapeutic activities;Therapeutic exercise;Balance training;Neuromuscular re-education;Patient/family education;Orthotic Fit/Training;Manual techniques;Passive range of motion;Dry needling;Taping;Traction;Iontophoresis 4mg /ml Dexamethasone;Ultrasound;Spinal Manipulations;Joint Manipulations    PT Next Visit Plan trial with child pose.  Continue spinal mobility  exercises, core and proximal strengthening.  Manual for pain/mobility.    PT Home Exercise Plan 10/11 POE, press ups; 10/15:  cervical and hip excursion, abset, bridge, hamstring stretch and knee to chest  Patient will benefit from skilled therapeutic intervention in order to improve the following deficits and impairments:  Abnormal gait, Improper body mechanics, Pain, Decreased mobility, Decreased activity tolerance, Decreased endurance, Decreased range of motion, Decreased strength, Hypomobility, Decreased balance, Difficulty walking, Postural dysfunction, Impaired flexibility  Visit Diagnosis: Low back pain, unspecified back pain laterality, unspecified chronicity, unspecified whether sciatica present  Cervicalgia  Muscle weakness (generalized)  Other abnormalities of gait and mobility  Other symptoms and signs involving the musculoskeletal system     Problem List There are no problems to display for this patient.  Becky Sax, LPTA/CLT; CBIS [phone]  Juel Burrow 02/10/2020, 1:06 PM  [facility] [address] [place], [place], [address] Phone: [phone]   Fax:  [phone]  Name: James Summers MRN: [id number] Date of Birth: 10/30/1960

## 2020-02-14 ENCOUNTER — Ambulatory Visit (HOSPITAL_COMMUNITY): Payer: Commercial Managed Care - PPO | Admitting: Physical Therapy

## 2020-02-14 ENCOUNTER — Other Ambulatory Visit: Payer: Self-pay

## 2020-02-14 ENCOUNTER — Encounter (HOSPITAL_COMMUNITY): Payer: Self-pay | Admitting: Physical Therapy

## 2020-02-14 DIAGNOSIS — M542 Cervicalgia: Secondary | ICD-10-CM

## 2020-02-14 DIAGNOSIS — M545 Low back pain, unspecified: Secondary | ICD-10-CM

## 2020-02-14 DIAGNOSIS — M6281 Muscle weakness (generalized): Secondary | ICD-10-CM

## 2020-02-14 DIAGNOSIS — M25551 Pain in right hip: Secondary | ICD-10-CM | POA: Diagnosis not present

## 2020-02-14 DIAGNOSIS — R29898 Other symptoms and signs involving the musculoskeletal system: Secondary | ICD-10-CM

## 2020-02-14 DIAGNOSIS — R2689 Other abnormalities of gait and mobility: Secondary | ICD-10-CM

## 2020-02-14 NOTE — Therapy (Signed)
[facility] [address] [place], [place], [address] Phone: [phone]   Fax:  [phone]  Physical Therapy Treatment  Patient Details  Name: James Summers MRN: [id number] Date of Birth: 09/07/1960 Referring Provider (PT): Margarita Rana, MD   Encounter Date: 02/14/2020   PT End of Session - 02/14/20 1001    Visit Number 5    Number of Visits 12    Date for PT Re-Evaluation 03/13/20    Authorization Type UMR/UHC no visit limit office may want a predertermination after 25 visits    Authorization - Visit Number 17    Authorization - Number of Visits 25    Progress Note Due on Visit 10    PT Start Time 1003    PT Stop Time 1053    PT Time Calculation (min) 50 min    Activity Tolerance Patient tolerated treatment well    Behavior During Therapy [facility] for tasks assessed/performed           History reviewed. No pertinent past medical history.  History reviewed. No pertinent surgical history.  There were no vitals filed for this visit.   Subjective Assessment - 02/14/20 1000    Subjective Patient states his back is stiff/sore. He is now back to work. He cant do his exercises as much as he likes.    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 Yes    Pain Score 3     Pain Location Back    Pain Onset 1 to 4 weeks ago                             [facility] Adult PT Treatment/Exercise - 02/14/20 0001      Lumbar Exercises: Stretches   Standing Extension 10 reps;5 seconds      Lumbar Exercises: Standing   Other Standing Lumbar Exercises palof press 2x10 bilateral green, standing row and extension 2x15 each blue band      Lumbar Exercises: Supine   Ab Set 10 reps    AB Set Limitations 5-10 second holds with breathing and cueing      Lumbar Exercises: Prone   Straight Leg Raise 10 reps;3 seconds    Straight Leg Raises Limitations 2 sets    Other  Prone Lumbar Exercises prone quad stretch with strap 3x 20 second holds bilateral                   PT Education - 02/14/20 1000    Education Details Patient educated on HEP, exercise mechanics, lumbar roll, TRA activation, modalities    Person(s) Educated Patient    Methods Explanation;Demonstration;Handout    Comprehension Verbalized understanding;Returned demonstration            PT Short Term Goals - 02/04/20 0944      PT SHORT TERM GOAL #1   Title Patient will report understanding and regular compliance with HEP to improve strength, mobility and decrease pain.    Time 3    Period Weeks    Status On-going    Target Date 02/21/20      PT SHORT TERM GOAL #2   Title Patient will report at least 25% improvement in symptoms for improved quality of life.    Time 3    Period Weeks    Status On-going    Target Date 02/21/20  PT Long Term Goals - 02/04/20 0944      PT LONG TERM GOAL #1   Title Patient will report at least 75% improvement in symptoms for improved quality of life.    Time 6    Period Weeks    Status On-going      PT LONG TERM GOAL #2   Title Patient will improve FOTO score by at least 10 points in order to indicate improved tolerance to activity.    Time 6    Period Weeks    Status On-going      PT LONG TERM GOAL #3   Title Patient will demonstrate at least 25% improvement in cervical ROM in all planes for improved ability to move head while driving.    Time 6    Period Weeks    Status On-going      PT LONG TERM GOAL #4   Title Patient will demonstrate at least 25% improvement in lumbar ROM in all planes with pain no greater than 1/10 for improved ability to move trunk with ADL.    Time 6    Period Weeks    Status On-going      PT LONG TERM GOAL #5   Status On-going                 Plan - 02/14/20 1001    Clinical Impression Statement Patient educated on lumbar roll to improve sitting posture during work as well as  adding standing extensions to improve lumbar mobility after sitting. Patient requires verbal and tactile cueing for TRA activation as well as cueing for breathing. Patient able to perform TRA with fair carry over. Added palof for additional core strengthening with cueing for abdominal activation throughout. Patient tolerates postural strengthening exercises without c/o symptoms. Patient ended session with prone quad stretch in order to improve muscle length. Patient will continue to benefit from skilled physical therapy in order to reduce impairment and improve function.    Personal Factors and Comorbidities Age;Comorbidity 1    Comorbidities Arthritis    Examination-Activity Limitations Stand;Locomotion Level;Bend;Dressing;Stairs;Squat;Sit;Carry;Lift;Transfers    Examination-Participation Restrictions Yard Work;Meal Prep;Community Activity;Driving    Stability/Clinical Decision Making Evolving/Moderate complexity    Rehab Potential Good    PT Frequency 2x / week    PT Duration 6 weeks    PT Treatment/Interventions ADLs/Self Care Home Management;Aquatic Therapy;Cryotherapy;Electrical Stimulation;Moist Heat;DME Instruction;Gait training;Stair training;Functional mobility training;Therapeutic activities;Therapeutic exercise;Balance training;Neuromuscular re-education;Patient/family education;Orthotic Fit/Training;Manual techniques;Passive range of motion;Dry needling;Taping;Traction;Iontophoresis 4mg /ml Dexamethasone;Ultrasound;Spinal Manipulations;Joint Manipulations    PT Next Visit Plan trial with child pose.  Continue spinal mobility exercises, core and proximal strengthening.  Manual for pain/mobility. f/u with lumbar roll and standing ext    PT Home Exercise Plan 10/11 POE, press ups; 10/15:  cervical and hip excursion, abset, bridge, hamstring stretch and knee to chest 10/25 standing ext, lumbar roll           Patient will benefit from skilled therapeutic intervention in order to improve the  following deficits and impairments:  Abnormal gait, Improper body mechanics, Pain, Decreased mobility, Decreased activity tolerance, Decreased endurance, Decreased range of motion, Decreased strength, Hypomobility, Decreased balance, Difficulty walking, Postural dysfunction, Impaired flexibility  Visit Diagnosis: Low back pain, unspecified back pain laterality, unspecified chronicity, unspecified whether sciatica present  Cervicalgia  Muscle weakness (generalized)  Other abnormalities of gait and mobility  Other symptoms and signs involving the musculoskeletal system     Problem List There are no problems to display for this patient.  11:03 AM, 02/14/20 Wyman Songster PT, DPT Physical Therapist at [facility]   [facility] [address] [place], [place], [address] Phone: [phone]   Fax:  [phone]  Name: Darshan Solanki MRN: [id number] Date of Birth: [birth year]

## 2020-02-16 ENCOUNTER — Other Ambulatory Visit: Payer: Self-pay

## 2020-02-16 ENCOUNTER — Encounter (HOSPITAL_COMMUNITY): Payer: Self-pay

## 2020-02-16 ENCOUNTER — Ambulatory Visit (HOSPITAL_COMMUNITY): Payer: Commercial Managed Care - PPO

## 2020-02-16 DIAGNOSIS — M545 Low back pain, unspecified: Secondary | ICD-10-CM

## 2020-02-16 DIAGNOSIS — R29898 Other symptoms and signs involving the musculoskeletal system: Secondary | ICD-10-CM

## 2020-02-16 DIAGNOSIS — M542 Cervicalgia: Secondary | ICD-10-CM

## 2020-02-16 DIAGNOSIS — R2689 Other abnormalities of gait and mobility: Secondary | ICD-10-CM

## 2020-02-16 DIAGNOSIS — M6281 Muscle weakness (generalized): Secondary | ICD-10-CM

## 2020-02-16 DIAGNOSIS — M25551 Pain in right hip: Secondary | ICD-10-CM | POA: Diagnosis not present

## 2020-02-16 NOTE — Therapy (Signed)
[facility] [address] [place], [place], [address] Phone: [phone]   Fax:  [phone]  Physical Therapy Treatment  Patient Details  Name: James Summers MRN: [id number] Date of Birth: [birth year] Referring Provider (PT): Margarita Rana, MD   Encounter Date: 02/16/2020   PT End of Session - 02/16/20 0927    Visit Number 6    Number of Visits 12    Date for PT Re-Evaluation 03/13/20    Authorization Type UMR/UHC no visit limit office may want a predertermination after 25 visits    Authorization - Visit Number 18    Authorization - Number of Visits 25    Progress Note Due on Visit 10    PT Start Time 0920   late sign in   PT Stop Time 0958    PT Time Calculation (min) 38 min    Activity Tolerance Patient tolerated treatment well    Behavior During Therapy [facility] for tasks assessed/performed           History reviewed. No pertinent past medical history.  History reviewed. No pertinent surgical history.  There were no vitals filed for this visit.   Subjective Assessment - 02/16/20 0924    Subjective Pt stated his back is achey/stiffness today.  Admits he has been unable to complete many of his exercises at home due to time restraints with RTW.  Reports he has began using lumbar roll, stated he is having some difficulty with correct size.    Pertinent History RT THA anterior approach 11/11/19    Patient Stated Goals improve his back    Pain Score 3     Pain Location Back    Pain Orientation Lower    Pain Descriptors / Indicators Aching;Tightness    Pain Type Acute pain    Pain Onset 1 to 4 weeks ago    Pain Frequency Intermittent    Aggravating Factors  Changing positions    Pain Relieving Factors nothing    Effect of Pain on Daily Activities limits                             [facility] Adult PT Treatment/Exercise - 02/16/20 0001      Exercises   Exercises Lumbar      Lumbar Exercises: Stretches   Standing  Extension 10 reps;5 seconds    Standing Extension Limitations 3D lumbar excursion    Press Ups 10 reps;10 seconds    Other Lumbar Stretch Exercise quad sets 3x 30"      Lumbar Exercises: Standing   Functional Squats 10 reps    Functional Squats Limitations 3D hip excursion    Other Standing Lumbar Exercises palof press 2x10 bilateral green, standing row and extension 2x15 each blue band      Lumbar Exercises: Seated   Other Seated Lumbar Exercises seated lumbar roll    Other Seated Lumbar Exercises Cervical excursion with scapular retaction      Lumbar Exercises: Prone   Straight Leg Raises Limitations 12 reps, 3" holds      Lumbar Exercises: Quadruped   Madcat/Old Horse 5 reps    Madcat/Old Horse Limitations 10" holds    Other Quadruped Lumbar Exercises child's pose 3x 30"                    PT Short Term Goals - 02/04/20 0944      PT SHORT TERM GOAL #1  Title Patient will report understanding and regular compliance with HEP to improve strength, mobility and decrease pain.    Time 3    Period Weeks    Status On-going    Target Date 02/21/20      PT SHORT TERM GOAL #2   Title Patient will report at least 25% improvement in symptoms for improved quality of life.    Time 3    Period Weeks    Status On-going    Target Date 02/21/20             PT Long Term Goals - 02/04/20 0944      PT LONG TERM GOAL #1   Title Patient will report at least 75% improvement in symptoms for improved quality of life.    Time 6    Period Weeks    Status On-going      PT LONG TERM GOAL #2   Title Patient will improve FOTO score by at least 10 points in order to indicate improved tolerance to activity.    Time 6    Period Weeks    Status On-going      PT LONG TERM GOAL #3   Title Patient will demonstrate at least 25% improvement in cervical ROM in all planes for improved ability to move head while driving.    Time 6    Period Weeks    Status On-going      PT LONG TERM  GOAL #4   Title Patient will demonstrate at least 25% improvement in lumbar ROM in all planes with pain no greater than 1/10 for improved ability to move trunk with ADL.    Time 6    Period Weeks    Status On-going      PT LONG TERM GOAL #5   Status On-going                 Plan - 02/16/20 0936    Clinical Impression Statement Continued session focus with spinal mobility and core/proximal strenghtening.  Added child's pose and resumed cat/camel with improved mechanics compared to day 1.  Pt tolerated well towards sessoin.  Reviewed lumbar roll positioning and discussed possible purchase of lumbar support as pt c/o towel coming unrolled when having to get in/out of trunk with work.  Added cervical excursions this session to address neck tightness.  Noted improved mobility with reps.  Reports of slight increased pain following sitting with lumbar roll to 4/10 at EOS.    Personal Factors and Comorbidities Age;Comorbidity 1    Comorbidities Arthritis    Examination-Activity Limitations Stand;Locomotion Level;Bend;Dressing;Stairs;Squat;Sit;Carry;Lift;Transfers    Examination-Participation Restrictions Yard Work;Meal Prep;Community Activity;Driving    Stability/Clinical Decision Making Evolving/Moderate complexity    Clinical Decision Making Moderate    Rehab Potential Good    PT Frequency 2x / week    PT Duration 6 weeks    PT Treatment/Interventions ADLs/Self Care Home Management;Aquatic Therapy;Cryotherapy;Electrical Stimulation;Moist Heat;DME Instruction;Gait training;Stair training;Functional mobility training;Therapeutic activities;Therapeutic exercise;Balance training;Neuromuscular re-education;Patient/family education;Orthotic Fit/Training;Manual techniques;Passive range of motion;Dry needling;Taping;Traction;Iontophoresis 4mg /ml Dexamethasone;Ultrasound;Spinal Manipulations;Joint Manipulations    PT Next Visit Plan Continue spinal mobility exercises, core and proximal strengthening.   Manual for pain/mobility. f/u with lumbar roll and standing ext    PT Home Exercise Plan 10/11 POE, press ups; 10/15:  cervical and hip excursion, abset, bridge, hamstring stretch and knee to chest 10/25 standing ext, lumbar roll           Patient will benefit from skilled therapeutic intervention in order to  improve the following deficits and impairments:  Abnormal gait, Improper body mechanics, Pain, Decreased mobility, Decreased activity tolerance, Decreased endurance, Decreased range of motion, Decreased strength, Hypomobility, Decreased balance, Difficulty walking, Postural dysfunction, Impaired flexibility  Visit Diagnosis: Low back pain, unspecified back pain laterality, unspecified chronicity, unspecified whether sciatica present  Cervicalgia  Muscle weakness (generalized)  Other abnormalities of gait and mobility  Other symptoms and signs involving the musculoskeletal system     Problem List There are no problems to display for this patient.  Becky Sax, LPTA/CLT; CBIS [phone]  Juel Burrow 02/16/2020, 10:04 AM  [facility] [address] [place], [place], [address] Phone: [phone]   Fax:  [phone]  Name: Kaizer Dissinger MRN: [id number] Date of Birth: [birth year]

## 2020-02-21 ENCOUNTER — Ambulatory Visit (HOSPITAL_COMMUNITY): Payer: Commercial Managed Care - PPO | Attending: Orthopedic Surgery | Admitting: Physical Therapy

## 2020-02-21 ENCOUNTER — Other Ambulatory Visit: Payer: Self-pay

## 2020-02-21 ENCOUNTER — Encounter (HOSPITAL_COMMUNITY): Payer: Self-pay | Admitting: Physical Therapy

## 2020-02-21 DIAGNOSIS — R2689 Other abnormalities of gait and mobility: Secondary | ICD-10-CM

## 2020-02-21 DIAGNOSIS — M545 Low back pain, unspecified: Secondary | ICD-10-CM | POA: Insufficient documentation

## 2020-02-21 DIAGNOSIS — R29898 Other symptoms and signs involving the musculoskeletal system: Secondary | ICD-10-CM | POA: Diagnosis present

## 2020-02-21 DIAGNOSIS — M6281 Muscle weakness (generalized): Secondary | ICD-10-CM | POA: Insufficient documentation

## 2020-02-21 DIAGNOSIS — M542 Cervicalgia: Secondary | ICD-10-CM | POA: Diagnosis present

## 2020-02-21 NOTE — Therapy (Signed)
[place], [place], [address] Phone: [phone]   Fax:  [phone]  Physical Therapy Treatment  Patient Details  Name: James Summers MRN: [id number] Date of Birth: April 28, 1960 Referring Provider (PT): Edmonia Lynch, MD   Encounter Date: 02/21/2020   PT End of Session - 02/21/20 0834    Visit Number 7    Number of Visits 12    Date for PT Re-Evaluation 03/13/20    Authorization Type UMR/UHC no visit limit office may want a predertermination after 25 visits    Authorization - Visit Number 19    Authorization - Number of Visits 25    Progress Note Due on Visit 10    PT Start Time 0835    PT Stop Time 0913    PT Time Calculation (min) 38 min    Activity Tolerance Patient tolerated treatment well    Behavior During Therapy [facility] for tasks assessed/performed           History reviewed. No pertinent past medical history.  History reviewed. No pertinent surgical history.  There were no vitals filed for this visit.   Subjective Assessment - 02/21/20 0834    Subjective Patient states he has been so so. He states sitting with towel roll aggravated his low back, neck and shoulder. His neck has been feeling very stiff. His low back has been feeling better since then but he does not feel like he has the full ROM. He has had several episodes of increased neck symptoms over the last week.    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 Yes    Pain Score 3     Pain Location Neck    Pain Onset 1 to 4 weeks ago                             [facility] Adult PT Treatment/Exercise - 02/21/20 0001      Lumbar Exercises: Standing   Other Standing Lumbar Exercises scap ret/depression at wall 2x10      Lumbar Exercises: Seated   Other Seated Lumbar Exercises scap retraction and depression 2x10      Lumbar Exercises: Supine   Other Supine  Lumbar Exercises cervical retraction 2x10      Manual Therapy   Manual Therapy Joint mobilization;Soft tissue mobilization    Manual therapy comments all manual interventions performed independently of other interventions    Joint Mobilization L UPA C3-C6    Soft tissue mobilization STM to cervical paraspinals L UT; self stm with cane to UT                  PT Education - 02/21/20 0833    Education Details Patient educated on HEP, exercise mechanics    Person(s) Educated Patient    Methods Explanation;Demonstration;Handout    Comprehension Verbalized understanding;Returned demonstration            PT Short Term Goals - 02/04/20 0944      PT SHORT TERM GOAL #1   Title Patient will report understanding and regular compliance with HEP to improve strength, mobility and decrease pain.    Time 3    Period Weeks    Status On-going    Target Date 02/21/20      PT SHORT TERM GOAL #2   Title Patient will report at least  25% improvement in symptoms for improved quality of life.    Time 3    Period Weeks    Status On-going    Target Date 02/21/20             PT Long Term Goals - 02/04/20 0944      PT LONG TERM GOAL #1   Title Patient will report at least 75% improvement in symptoms for improved quality of life.    Time 6    Period Weeks    Status On-going      PT LONG TERM GOAL #2   Title Patient will improve FOTO score by at least 10 points in order to indicate improved tolerance to activity.    Time 6    Period Weeks    Status On-going      PT LONG TERM GOAL #3   Title Patient will demonstrate at least 25% improvement in cervical ROM in all planes for improved ability to move head while driving.    Time 6    Period Weeks    Status On-going      PT LONG TERM GOAL #4   Title Patient will demonstrate at least 25% improvement in lumbar ROM in all planes with pain no greater than 1/10 for improved ability to move trunk with ADL.    Time 6    Period Weeks     Status On-going      PT LONG TERM GOAL #5   Status On-going                 Plan - 02/21/20 0834    Clinical Impression Statement Patient very tender with trigger point noted in L UT. He also has hypomobile cervical spine. Patient tolerates manual well with decrease in tissue resistance following and improved cervical ROM. Shown patient how to complete self stm with cane which is tolerated well with improvement in symptoms following. He requires verbal and tactile cueing to complete cervical retraction in supine with good carry over. He requires extensive cueing for UT stretch in seated to achieve stretch and states muscles slightly aggravated follow. Patient will continue to benefit from skilled physical therapy to reduce impairment and improve function.    Personal Factors and Comorbidities Age;Comorbidity 1    Comorbidities Arthritis    Examination-Activity Limitations Stand;Locomotion Level;Bend;Dressing;Stairs;Squat;Sit;Carry;Lift;Transfers    Examination-Participation Restrictions Yard Work;Meal Prep;Community Activity;Driving    Stability/Clinical Decision Making Evolving/Moderate complexity    Rehab Potential Good    PT Frequency 2x / week    PT Duration 6 weeks    PT Treatment/Interventions ADLs/Self Care Home Management;Aquatic Therapy;Cryotherapy;Electrical Stimulation;Moist Heat;DME Instruction;Gait training;Stair training;Functional mobility training;Therapeutic activities;Therapeutic exercise;Balance training;Neuromuscular re-education;Patient/family education;Orthotic Fit/Training;Manual techniques;Passive range of motion;Dry needling;Taping;Traction;Iontophoresis 73m/ml Dexamethasone;Ultrasound;Spinal Manipulations;Joint Manipulations    PT Next Visit Plan Continue spinal mobility exercises, core and proximal strengthening.  Manual for pain/mobility. f/u with lumbar roll and standing ext    PT Home Exercise Plan 10/11 POE, press ups; 10/15:  cervical and hip excursion,  abset, bridge, hamstring stretch and knee to chest 10/25 standing ext, lumbar roll 11/1 cervical retraction           Patient will benefit from skilled therapeutic intervention in order to improve the following deficits and impairments:  Abnormal gait, Improper body mechanics, Pain, Decreased mobility, Decreased activity tolerance, Decreased endurance, Decreased range of motion, Decreased strength, Hypomobility, Decreased balance, Difficulty walking, Postural dysfunction, Impaired flexibility  Visit Diagnosis: Low back pain, unspecified back pain laterality, unspecified chronicity, unspecified whether sciatica  present  Cervicalgia  Muscle weakness (generalized)  Other abnormalities of gait and mobility  Other symptoms and signs involving the musculoskeletal system     Problem List There are no problems to display for this patient.  9:18 AM, 02/21/20 Mearl Latin PT, DPT Physical Therapist at [place], [place], [address] Phone: [phone]   Fax:  [phone]  Name: James Summers MRN: [id number] Date of Birth: [birth year]

## 2020-02-21 NOTE — Patient Instructions (Signed)
Access Code: [email or web address] URL: [email or web address] Date: 02/21/2020 Prepared by: Greig Castilla Ailis Rigaud  Exercises Supine Chin Tuck - 1 x daily - 7 x weekly - 2 sets - 10 reps

## 2020-02-24 ENCOUNTER — Other Ambulatory Visit: Payer: Self-pay

## 2020-02-24 ENCOUNTER — Encounter (HOSPITAL_COMMUNITY): Payer: Self-pay | Admitting: Physical Therapy

## 2020-02-24 ENCOUNTER — Ambulatory Visit (HOSPITAL_COMMUNITY): Payer: Commercial Managed Care - PPO | Admitting: Physical Therapy

## 2020-02-24 DIAGNOSIS — M6281 Muscle weakness (generalized): Secondary | ICD-10-CM

## 2020-02-24 DIAGNOSIS — R29898 Other symptoms and signs involving the musculoskeletal system: Secondary | ICD-10-CM

## 2020-02-24 DIAGNOSIS — M545 Low back pain, unspecified: Secondary | ICD-10-CM

## 2020-02-24 DIAGNOSIS — R2689 Other abnormalities of gait and mobility: Secondary | ICD-10-CM

## 2020-02-24 DIAGNOSIS — M542 Cervicalgia: Secondary | ICD-10-CM

## 2020-02-24 NOTE — Therapy (Signed)
[facility] [address] [place], [place], [address] Phone: [phone]   Fax:  [phone]  Physical Therapy Treatment/Discharge Summary  Patient Details  Name: James Summers MRN: [id number] Date of Birth: January 02, 1961 Referring Provider (PT): Edmonia Lynch, MD   Encounter Date: 02/24/2020  PHYSICAL THERAPY DISCHARGE SUMMARY  Visits from Start of Care: 8  Current functional level related to goals / functional outcomes: See below   Remaining deficits: See below   Education / Equipment: See below  Plan: Patient agrees to discharge.  Patient goals were not met. Patient is being discharged due to lack of progress.  ?????        PT End of Session - 02/24/20 0834    Visit Number 8    Number of Visits 12    Date for PT Re-Evaluation 03/13/20    Authorization Type UMR/UHC no visit limit office may want a predertermination after 25 visits    Authorization - Visit Number 20    Authorization - Number of Visits 25    Progress Note Due on Visit 10    PT Start Time 0834    PT Stop Time 0928    PT Time Calculation (min) 54 min    Activity Tolerance Patient tolerated treatment well    Behavior During Therapy [facility] for tasks assessed/performed           History reviewed. No pertinent past medical history.  History reviewed. No pertinent surgical history.  There were no vitals filed for this visit.   Subjective Assessment - 02/24/20 0835    Subjective Patient states his day was going well until he felt a tug feeling in his low back and now it aches. Maybe a little improvement in his neck. He feels that things are better but not as quick as he would like. He continues to remain limited by intermittent increases in pain. States he performs some exercises during the day but the others about 2x/week.    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 Yes    Pain Score 4      Pain Location Back    Pain Descriptors / Indicators Aching;Tightness    Pain Onset 1 to 4 weeks ago              [facility] PT Assessment - 02/24/20 0001      Assessment   Medical Diagnosis Cervicalgia/Lumbago    Referring Provider (PT) Edmonia Lynch, MD    Onset Date/Surgical Date 01/01/20    Next MD Visit 03/08/20      Precautions   Precautions None    Precaution Comments s/p R THA anterior      Restrictions   Weight Bearing Restrictions No      Balance Screen   Has the patient fallen in the past 6 months No    Has the patient had a decrease in activity level because of a fear of falling?  No    Is the patient reluctant to leave their home because of a fear of falling?  No      Home Ecologist residence      Prior Function   Level of Independence Independent      Cognition   Overall Cognitive Status Within Functional Limits for tasks assessed      Observation/Other Assessments   Observations Ambulates without AD  AROM   Cervical Flexion 25% limited, minline pain    Cervical Extension 50% limited    Cervical - Right Side Bend 25% limited    Cervical - Left Side Bend 50% limited    Cervical - Right Rotation 50% limited    Cervical - Left Rotation 50% limited    Lumbar Flexion 25% limited    Lumbar Extension 50% limited    Lumbar - Right Side Bend 50% limited    Lumbar - Left Side Bend 50% limited    Lumbar - Right Rotation 25% limited    Lumbar - Left Rotation 25% limited                                 PT Education - 02/24/20 0834    Education Details Patient educated on HEP, exercise mechanics, importance of compliance and consistency with HEP, review of HEP, aquatic therapy, returning to PT if needed, chronic pain, importance of exercise and gradual progression, [employer], lumbar roll sizing and posture    Person(s) Educated Patient    Methods Explanation;Demonstration;Handout    Comprehension  Verbalized understanding;Returned demonstration            PT Short Term Goals - 02/24/20 0940      PT SHORT TERM GOAL #1   Title Patient will report understanding and regular compliance with HEP to improve strength, mobility and decrease pain.    Time 3    Period Weeks    Status Not Met    Target Date 02/21/20      PT SHORT TERM GOAL #2   Title Patient will report at least 25% improvement in symptoms for improved quality of life.    Time 3    Period Weeks    Status Not Met    Target Date 02/21/20             PT Long Term Goals - 02/24/20 0940      PT LONG TERM GOAL #1   Title Patient will report at least 75% improvement in symptoms for improved quality of life.    Time 6    Period Weeks    Status Not Met      PT LONG TERM GOAL #2   Title Patient will improve FOTO score by at least 10 points in order to indicate improved tolerance to activity.    Time 6    Period Weeks    Status Not Met      PT LONG TERM GOAL #3   Title Patient will demonstrate at least 25% improvement in cervical ROM in all planes for improved ability to move head while driving.    Time 6    Period Weeks    Status Not Met      PT LONG TERM GOAL #4   Title Patient will demonstrate at least 25% improvement in lumbar ROM in all planes with pain no greater than 1/10 for improved ability to move trunk with ADL.    Time 6    Period Weeks    Status Not Met                 Plan - 02/24/20 0834    Clinical Impression Statement Patient has not met short and long term goals due to continued symptoms, decreased frequency of completing HEP, impaired core strength, impaired activity tolerance and functional mobility. Patient showing improving ROM in cervical and lumbar spine but  continues to remain limited by stiffness/pain. Patient has made progress toward goals but progress limited likely due to intermittent compliance with HEP. Patient educated extensively today on importance of compliance and  adherence to HEP. Patient educated on HEP, exercise mechanics, aquatic therapy, returning to PT if needed, chronic pain, importance of exercise and gradual progression, [employer], lumbar roll sizing and posture. Reviewed HEP with patient today and added exercises for postural and core strengthening as well as spinal mobility. Patient discharged from physical therapy at this time.    Personal Factors and Comorbidities Age;Comorbidity 1    Comorbidities Arthritis    Examination-Activity Limitations Stand;Locomotion Level;Bend;Dressing;Stairs;Squat;Sit;Carry;Lift;Transfers    Examination-Participation Restrictions Yard Work;Meal Prep;Community Activity;Driving    Stability/Clinical Decision Making --    Rehab Potential Good    PT Frequency --    PT Duration --    PT Treatment/Interventions ADLs/Self Care Home Management;Aquatic Therapy;Cryotherapy;Electrical Stimulation;Moist Heat;DME Instruction;Gait training;Stair training;Functional mobility training;Therapeutic activities;Therapeutic exercise;Balance training;Neuromuscular re-education;Patient/family education;Orthotic Fit/Training;Manual techniques;Passive range of motion;Dry needling;Taping;Traction;Iontophoresis 37m/ml Dexamethasone;Ultrasound;Spinal Manipulations;Joint Manipulations    PT Next Visit Plan n/a    PT Home Exercise Plan 10/11 POE, press ups; 10/15:  cervical and hip excursion, abset, bridge, hamstring stretch and knee to chest 10/25 standing ext, lumbar roll 11/1 cervical retraction 11/4 Row, ext, palof, cat camel, scap ret           Patient will benefit from skilled therapeutic intervention in order to improve the following deficits and impairments:  Abnormal gait, Improper body mechanics, Pain, Decreased mobility, Decreased activity tolerance, Decreased endurance, Decreased range of motion, Decreased strength, Hypomobility, Decreased balance, Difficulty walking, Postural dysfunction, Impaired flexibility  Visit Diagnosis: Low back  pain, unspecified back pain laterality, unspecified chronicity, unspecified whether sciatica present  Cervicalgia  Muscle weakness (generalized)  Other abnormalities of gait and mobility  Other symptoms and signs involving the musculoskeletal system     Problem List There are no problems to display for this patient.   9:48 AM, 02/24/20 AMearl LatinPT, DPT [place] [address]Phone: [phone]  Fax:  [phone] Name: James PenzaMRN: [id number] of Birth: [birth year]

## 2020-02-24 NOTE — Patient Instructions (Signed)
Access Code: [email or web address] URL: [email or web address] Date: 02/24/2020 Prepared by: [facility] Greeson  Exercises Standing Shoulder Row with Anchored Resistance - 1 x daily - 7 x weekly - 2 sets - 15 reps Shoulder extension with resistance - Neutral - 1 x daily - 7 x weekly - 2 sets - 15 reps Standing Anti-Rotation Press with Anchored Resistance - 1 x daily - 7 x weekly - 2 sets - 10 reps Seated Scapular Retraction - 1 x daily - 7 x weekly - 2 sets - 10 reps Cat-Camel - 1 x daily - 7 x weekly - 2 sets - 10 reps

## 2020-02-28 ENCOUNTER — Ambulatory Visit (HOSPITAL_COMMUNITY): Payer: Commercial Managed Care - PPO | Admitting: Physical Therapy

## 2020-03-02 ENCOUNTER — Encounter (HOSPITAL_COMMUNITY): Payer: Commercial Managed Care - PPO

## 2020-03-06 ENCOUNTER — Encounter (HOSPITAL_COMMUNITY): Payer: Commercial Managed Care - PPO | Admitting: Physical Therapy

## 2020-03-08 ENCOUNTER — Other Ambulatory Visit: Payer: Self-pay | Admitting: Orthopedic Surgery

## 2020-03-08 DIAGNOSIS — M545 Low back pain, unspecified: Secondary | ICD-10-CM

## 2020-03-09 ENCOUNTER — Encounter (HOSPITAL_COMMUNITY): Payer: Commercial Managed Care - PPO

## 2020-03-13 ENCOUNTER — Encounter (HOSPITAL_COMMUNITY): Payer: Commercial Managed Care - PPO | Admitting: Physical Therapy

## 2020-03-15 ENCOUNTER — Encounter (HOSPITAL_COMMUNITY): Payer: Commercial Managed Care - PPO | Admitting: Physical Therapy

## 2020-03-27 ENCOUNTER — Other Ambulatory Visit: Payer: Self-pay | Admitting: Orthopedic Surgery

## 2020-03-28 ENCOUNTER — Other Ambulatory Visit: Payer: Self-pay

## 2020-03-28 ENCOUNTER — Ambulatory Visit
Admission: RE | Admit: 2020-03-28 | Discharge: 2020-03-28 | Disposition: A | Discharge status: Home / Self Care | Payer: Commercial Managed Care - PPO | Source: Ambulatory Visit | Attending: Orthopedic Surgery | Admitting: Orthopedic Surgery

## 2020-03-28 DIAGNOSIS — M545 Low back pain, unspecified: Secondary | ICD-10-CM

## 2020-03-28 IMAGING — MR MR LUMBAR SPINE W/O CM
4 of 5 series · 26 of 48 positions shown · non-contrast
Comparison: None.

CLINICAL DATA: Chronic lower back pain.

EXAM:
MRI LUMBAR SPINE WITHOUT CONTRAST
TECHNIQUE: Multiplanar, multisequence MR imaging of the lumbar spine was
performed. No intravenous contrast was administered.

[Series 3: T2 · sagittal · 4.0mm · 0.55mm/px · 5 of 18 slices shown (1 of 2)]
[im 1/18]
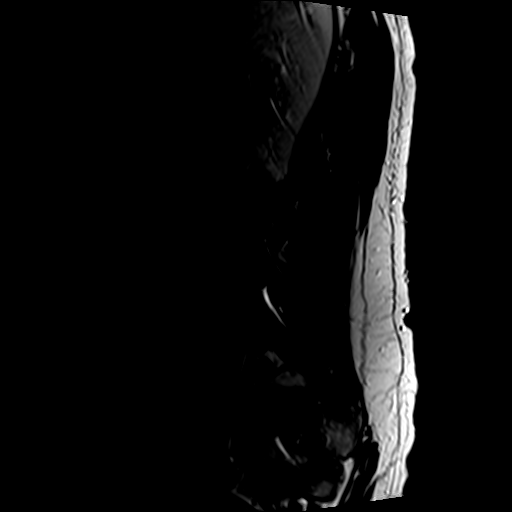
[im 5/18]
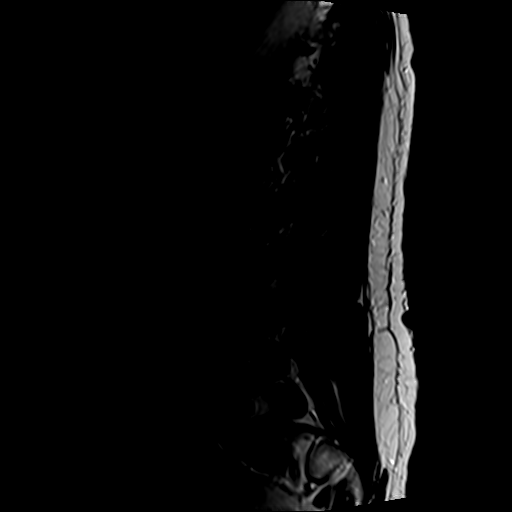
[im 9/18]
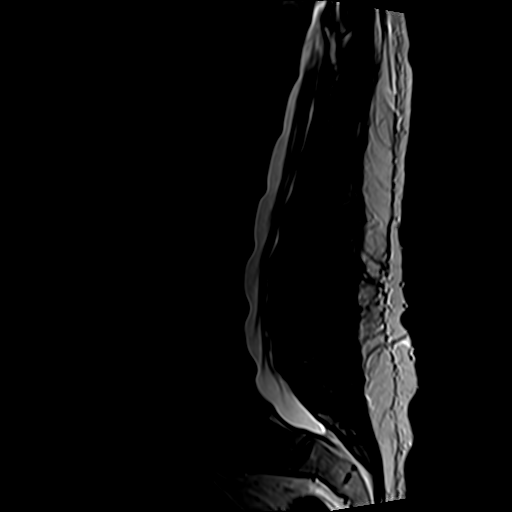
[im 13/18]
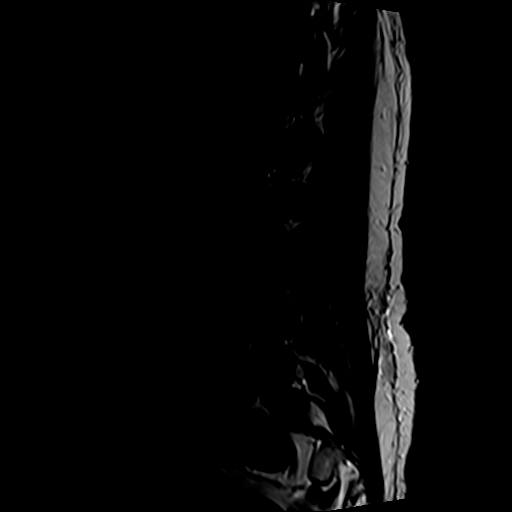
[im 18/18]
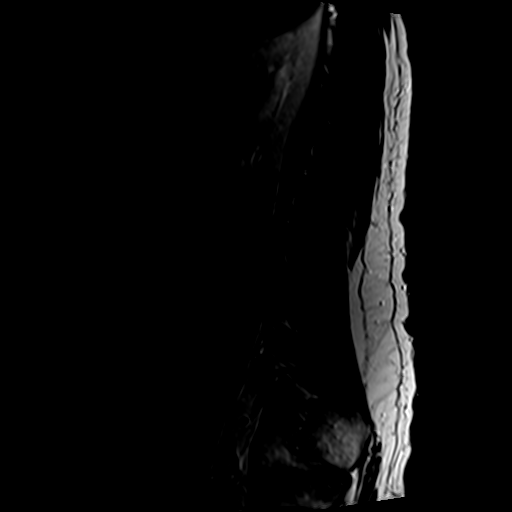

[Series 5: T1 · sagittal · 4.0mm · 0.53mm/px · 6 of 18 slices shown (1 of 2)]
[im 1/18]
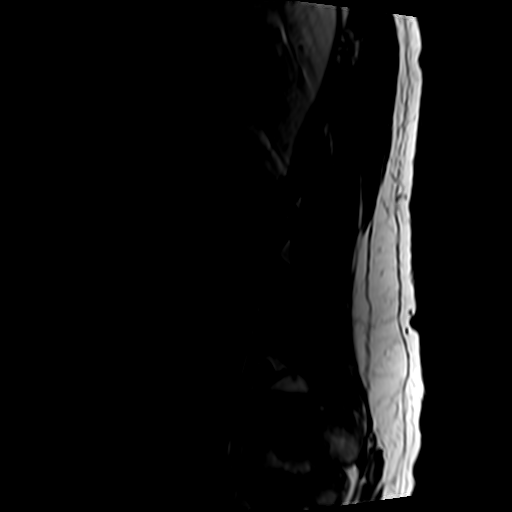
[im 4/18]
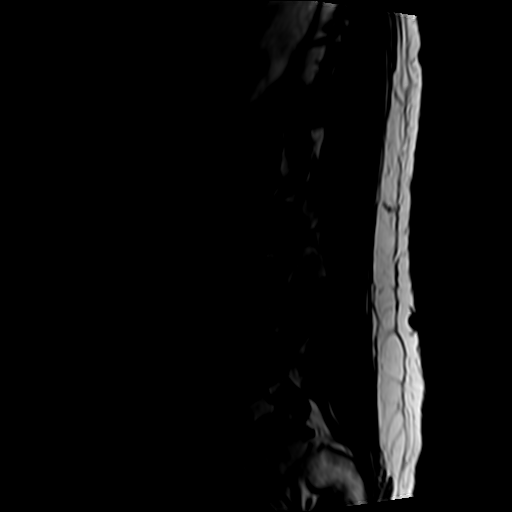
[im 7/18]
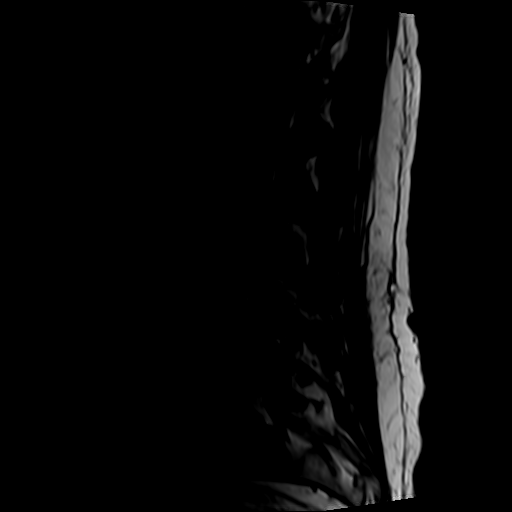
[im 11/18]
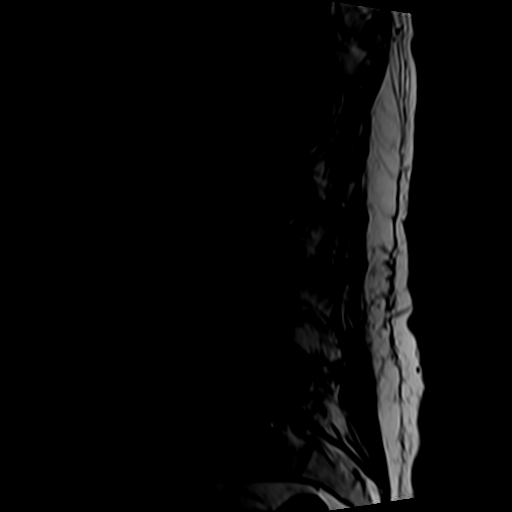
[im 14/18]
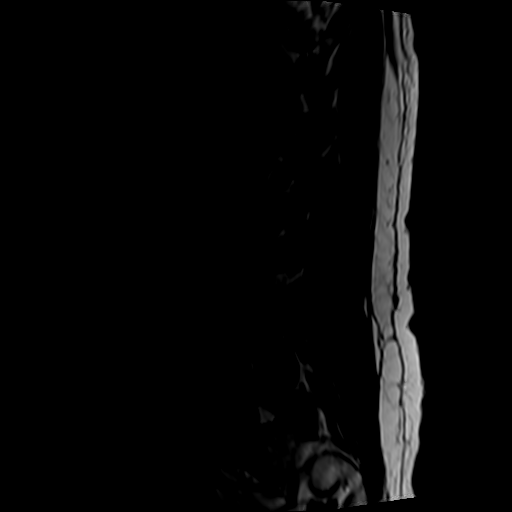
[im 18/18]
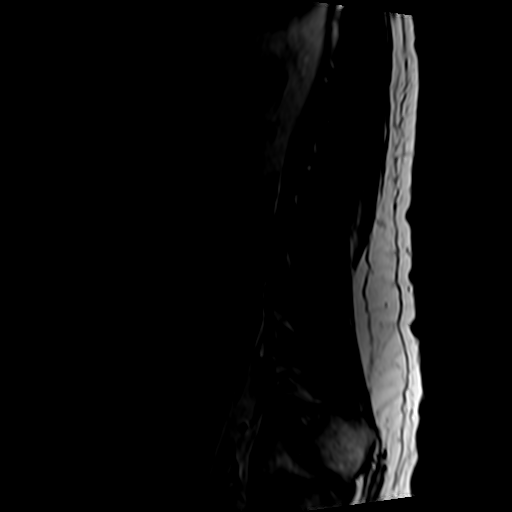

[Series 6: T2 · axial · 4.0mm · 0.70mm/px · z∈[-149,+98]mm · 10 of 51 slices shown (2 of 2)]
[im 4/51]
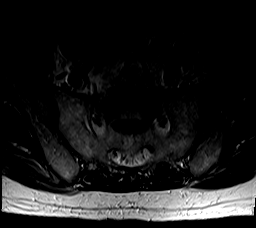
[im 7/51]
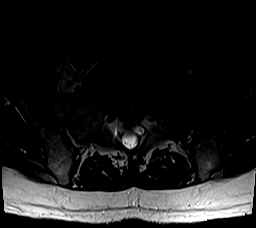
[im 11/51]
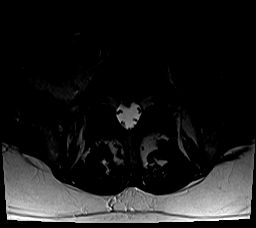
[im 17/51]
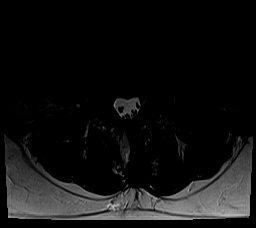
[im 24/51]
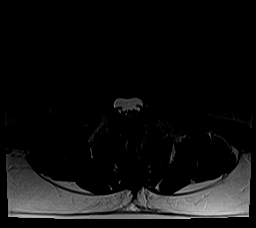
[im 27/51]
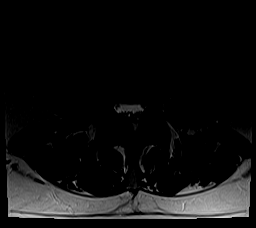
[im 31/51]
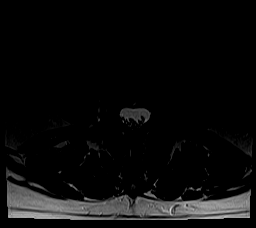
[im 37/51]
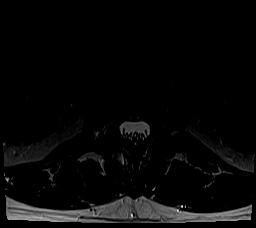
[im 44/51]
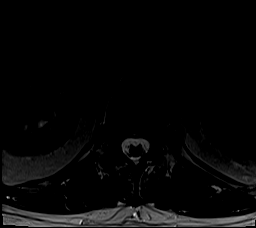
[im 51/51]
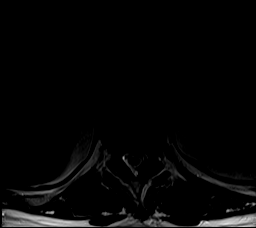

[Series 7: T1 · axial · 4.0mm · 0.35mm/px · z∈[-149,+62]mm · 5 of 51 slices shown (2 of 2)]
[im 4/51]
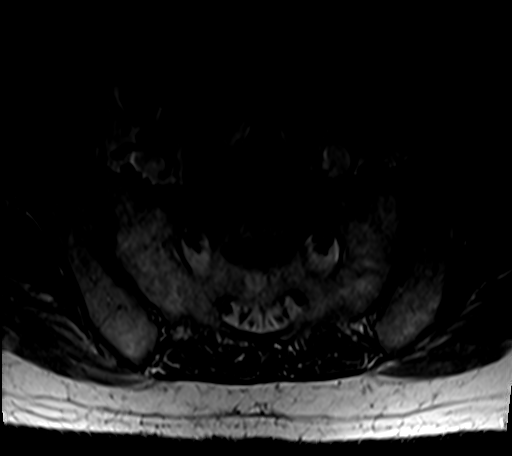
[im 7/51]
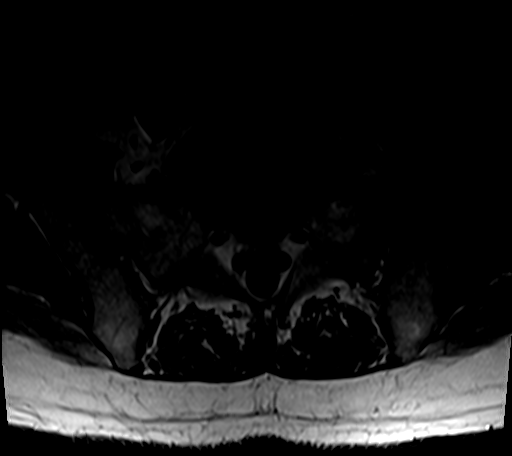
[im 11/51]
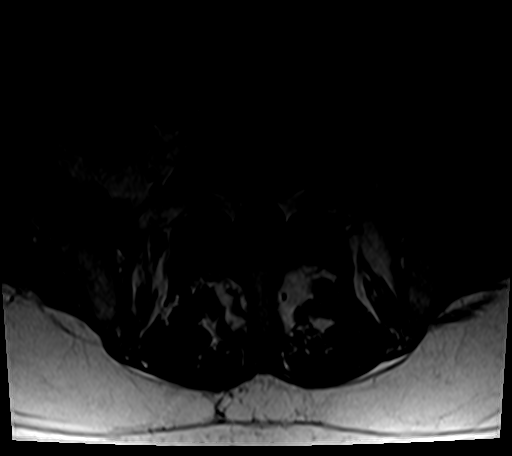
[im 27/51]
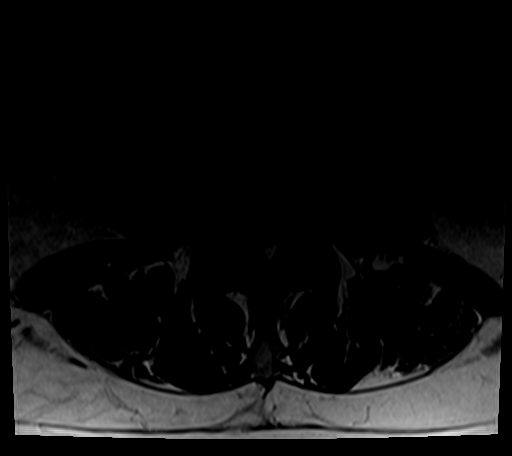
[im 44/51]
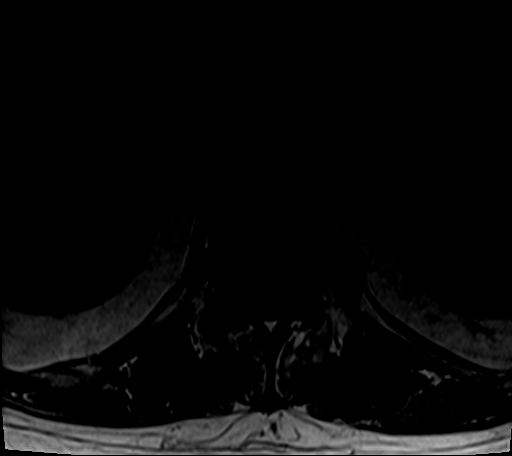

[26 of 48 positions shown; findings below may reference images not displayed]

FINDINGS: Segmentation:  Standard.

Alignment:  Normal.

Vertebrae: Vertebral body heights are preserved. Multilevel Modic
type 2 endplate degenerative changes. Prominent Schmorl's node
formation at the T11-12 level.

Conus medullaris and cauda equina: Conus extends to the L1 level.
Conus and cauda equina appear normal.

Disc levels: Multilevel desiccation and mild disc space loss.

L1-2: Mild disc bulge and bilateral facet degenerative spurring.
Patent spinal canal. Mild bilateral neural foraminal narrowing.

L2-3: Right predominant disc bulge superimposed right foraminal
protrusion. Bilateral facet degenerative spurring. Mild spinal canal
and bilateral neural foraminal narrowing.

L3-4: Disc bulge with superimposed left paracentral/foraminal
protrusion. There is abutment of the descending left L4 nerve root.
Prominent ligamentum flavum and bilateral facet degenerative
spurring. There is also a superimposed right foraminal protrusion.
Mild spinal canal and bilateral neural foraminal narrowing.

L4-5: Disc bulge with superimposed inferiorly oriented left
paracentral/subarticular extrusion abutting the descending left L5
nerve root. Ligamentum flavum thickening and bilateral facet
degenerative spurring. Mild spinal canal. Moderate bilateral neural
foraminal narrowing.

L5-S1: Disc bulge with superimposed right paracentral
protrusion/annular fissuring abutting the descending right S1 nerve
root. Facet degenerative spurring. Mild right neural foraminal
narrowing. Patent spinal canal and left neural foramen.

Paraspinal and other soft tissues: Negative.
IMPRESSION: 1. Multilevel spondylosis.
2. Inferiorly oriented left L4-5 paracentral/subarticular extrusion
abutting the descending left L5 nerve root. Mild spinal canal and
moderate bilateral neural foraminal narrowing at this level.
3. Left L3-4 paracentral/foraminal protrusion abutting the
descending left L4 nerve root. Mild spinal canal and bilateral
neural foraminal narrowing at this level.
4. Right L5-S1 paracentral protrusion/annular fissuring abutting the
descending right S1 nerve root with mild neural foraminal narrowing.

## 2020-03-28 IMAGING — MR MR CERVICAL SPINE W/O CM
4 of 5 series · 30 of 48 positions shown · non-contrast
Comparison: None.

CLINICAL DATA: Chronic neck pain.

EXAM:
MRI CERVICAL SPINE WITHOUT CONTRAST
TECHNIQUE: Multiplanar, multisequence MR imaging of the cervical spine was
performed. No intravenous contrast was administered.

[Series 3: T2 · sagittal · 3.0mm · 0.82mm/px · 8 of 17 slices shown (1 of 2)]
[im 1/17]
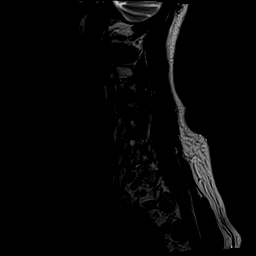
[im 3/17]
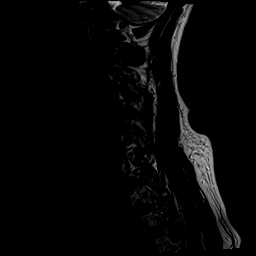
[im 5/17]
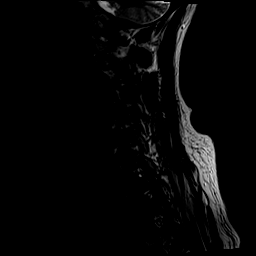
[im 7/17]
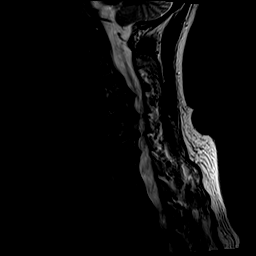
[im 10/17]
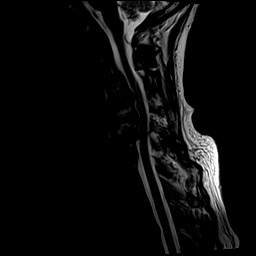
[im 12/17]
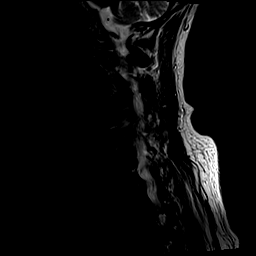
[im 14/17]
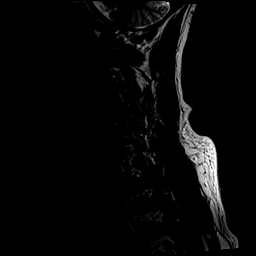
[im 17/17]
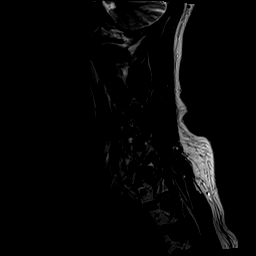

[Series 4: T1 · sagittal · 3.0mm · 0.41mm/px · 7 of 17 slices shown]
[im 1/17]
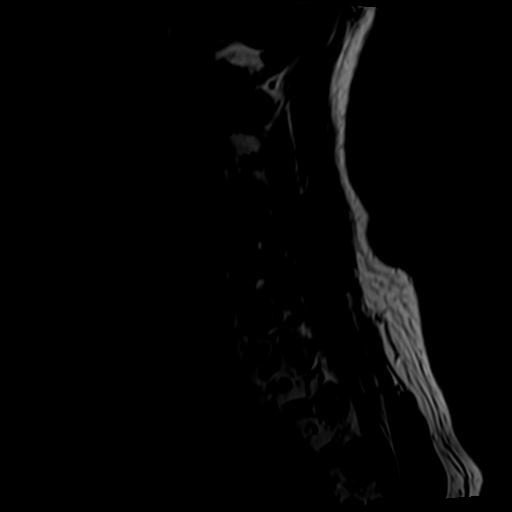
[im 3/17]
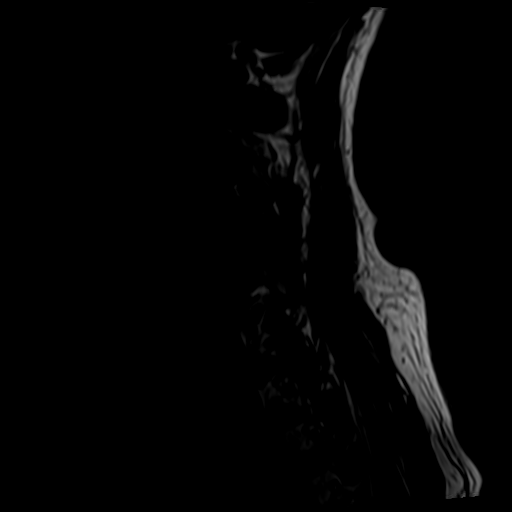
[im 6/17]
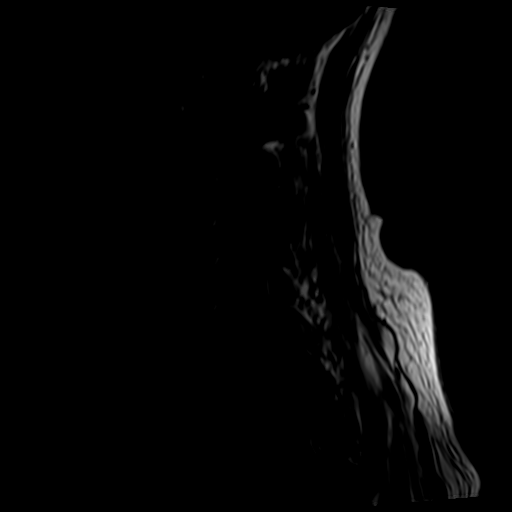
[im 9/17]
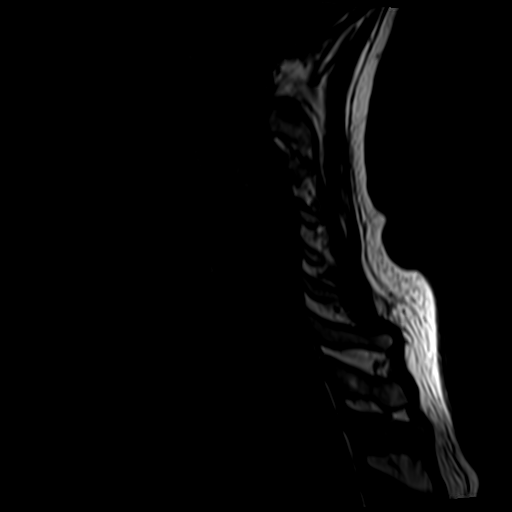
[im 11/17]
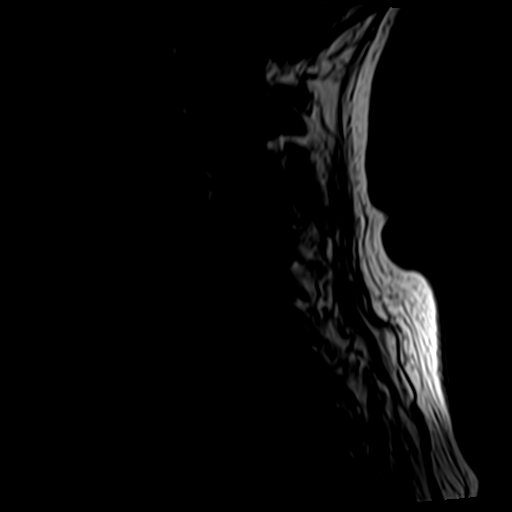
[im 14/17]
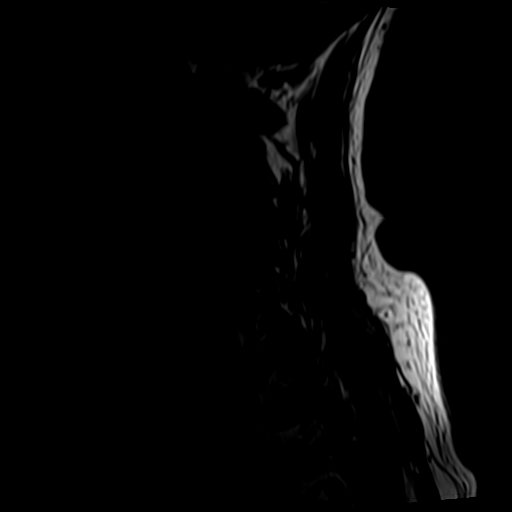
[im 17/17]
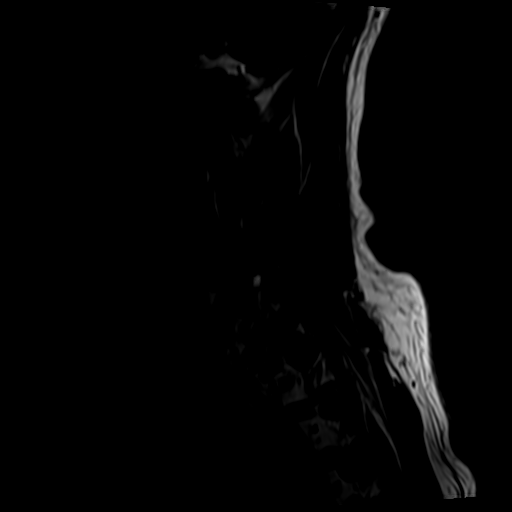

[Series 5: tir sag · sagittal · 3.0mm · 0.41mm/px · 6 of 17 slices shown]
[im 1/17]
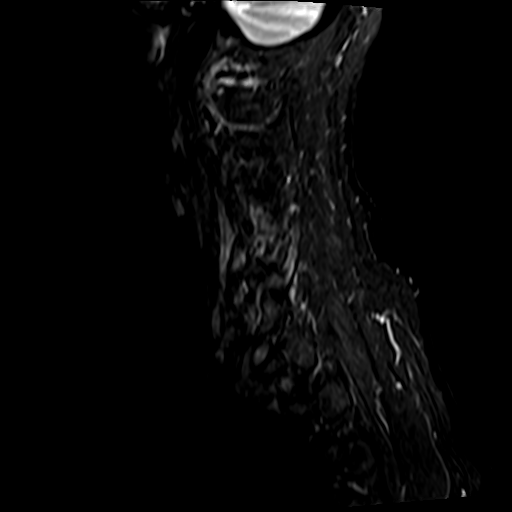
[im 3/17]
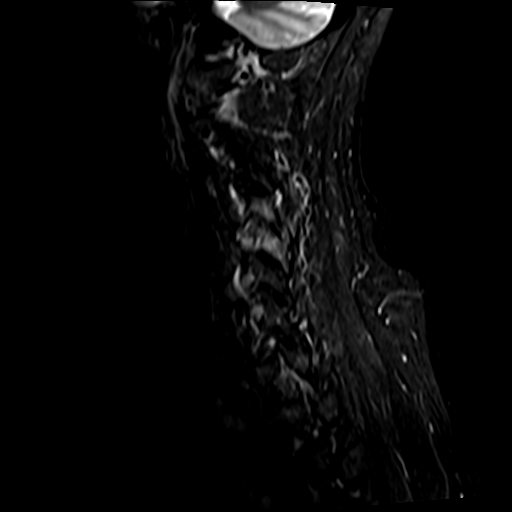
[im 6/17]
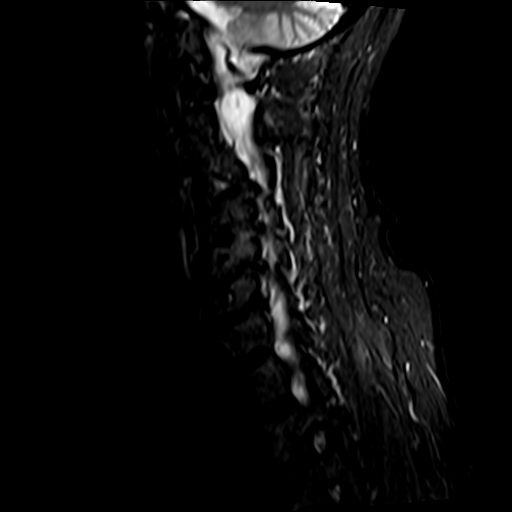
[im 9/17]
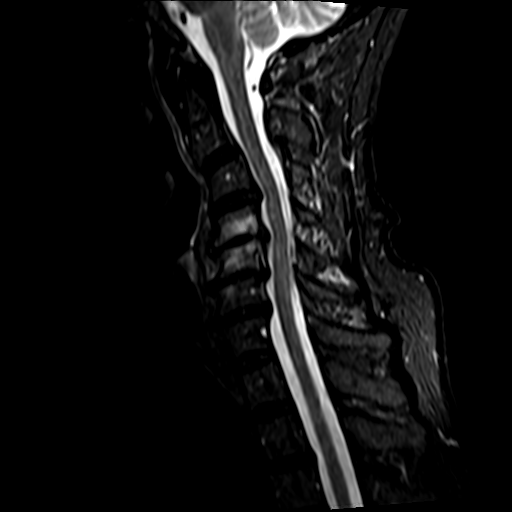
[im 11/17]
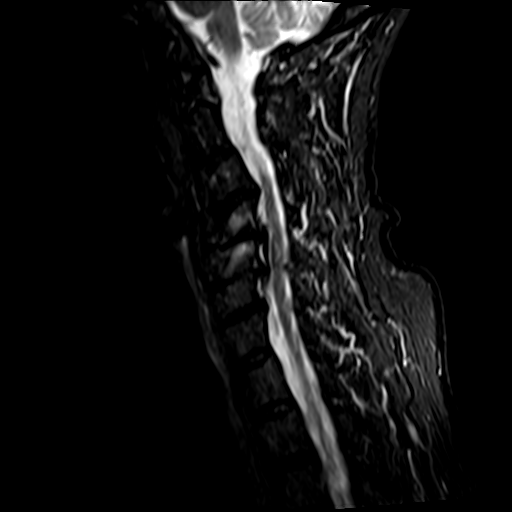
[im 14/17]
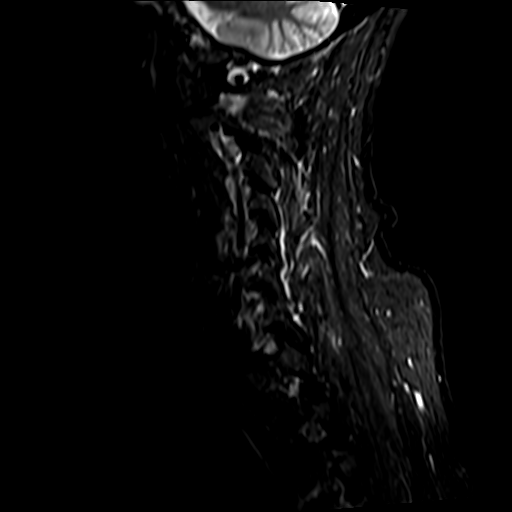

[Series 7: T2 · axial · 3.0mm · 0.70mm/px · z∈[-83,+32]mm · 9 of 32 slices shown (2 of 2)]
[im 1/32]
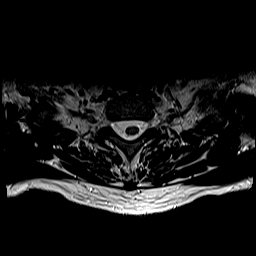
[im 6/32]
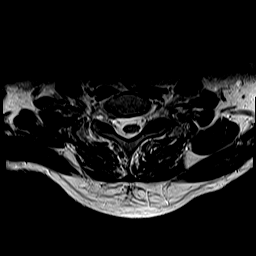
[im 11/32]
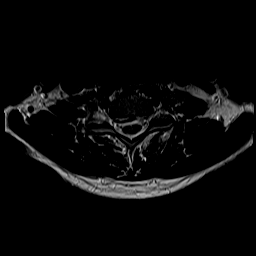
[im 13/32]
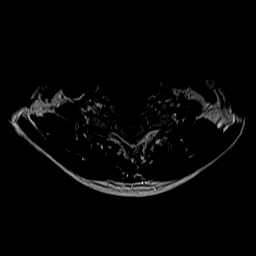
[im 16/32]
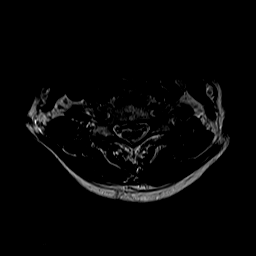
[im 19/32]
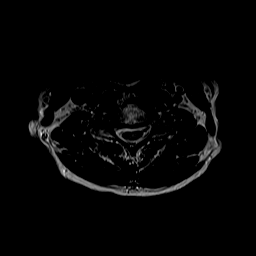
[im 21/32]
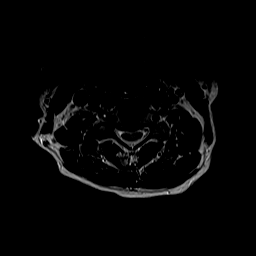
[im 26/32]
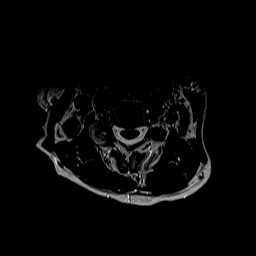
[im 32/32]
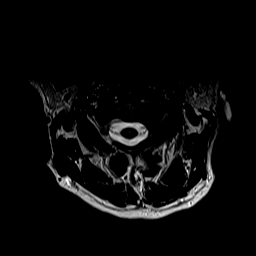

[30 of 48 positions shown; findings below may reference images not displayed]

FINDINGS: Alignment: Reversal of cervical lordosis. Grade 1 C2-3
anterolisthesis. Stepwise grade 1 C4-7 retrolisthesis.

Vertebrae: Vertebral body heights are preserved. Multilevel Modic
type 1 endplate degenerative changes most prominent at the C4-6
levels. Mild degenerative related periarticular edema involving the
right C4-5 facet joints.

Cord: Normal signal and morphology.

Posterior Fossa, vertebral arteries: Negative.

Disc levels: Multilevel desiccation and disc space loss.

C2-3: Shallow central protrusion. Uncovertebral and facet
degenerative spurring. Mild right neural foraminal narrowing. Patent
spinal canal and left neural foramen.

C3-4: Disc osteophyte complex with superimposed central and right
foraminal protrusions abutting the ventral cord. Uncovertebral and
facet hypertrophy. Mild spinal canal, severe right and moderate left
neural foraminal narrowing.

C4-5: Disc osteophyte complex with superimposed central protrusion
abutting the ventral cord. Uncovertebral and facet hypertrophy. Mild
spinal canal, moderate right and mild left neural foraminal
narrowing.

C5-6: Disc osteophyte complex with superimposed right paracentral
protrusion abutting the ventral cord. Uncovertebral and facet
hypertrophy. Mild-to-moderate spinal canal, severe right and
moderate left neural foraminal narrowing.

C6-7: Disc osteophyte complex with superimposed right subarticular
protrusion/annular fissuring. Uncovertebral and facet degenerative
spurring. Mild spinal canal and bilateral neural foraminal
narrowing.

C7-T1: No significant disc bulge. Facet degenerative spurring.
Patent spinal canal and neural foramen. 3 mm right perineural cyst.

Paraspinal tissues: Negative.
IMPRESSION: Multilevel spondylosis. Mild-to-moderate spinal canal, severe right
and moderate left neural foraminal narrowing at the C5-6 level.

Moderate to severe bilateral C3-4, right C4-5 neural foraminal
narrowing.

Mild C3-4 and C4-5 spinal canal narrowing.

## 2020-09-08 LAB — COLOGUARD: COLOGUARD: NEGATIVE

## 2020-09-08 LAB — EXTERNAL GENERIC LAB PROCEDURE: COLOGUARD: NEGATIVE

## 2021-03-02 ENCOUNTER — Other Ambulatory Visit: Payer: Self-pay | Admitting: Physical Medicine and Rehabilitation

## 2021-03-02 DIAGNOSIS — M545 Low back pain, unspecified: Secondary | ICD-10-CM

## 2021-03-22 ENCOUNTER — Other Ambulatory Visit: Payer: Self-pay

## 2021-03-22 ENCOUNTER — Ambulatory Visit
Admission: RE | Admit: 2021-03-22 | Discharge: 2021-03-22 | Disposition: A | Discharge status: Home / Self Care | Payer: Commercial Managed Care - PPO | Source: Ambulatory Visit | Attending: Physical Medicine and Rehabilitation | Admitting: Physical Medicine and Rehabilitation

## 2021-03-22 DIAGNOSIS — M545 Low back pain, unspecified: Secondary | ICD-10-CM

## 2021-03-22 IMAGING — MR MR LUMBAR SPINE W/O CM
4 of 5 series · 19 of 48 positions shown · non-contrast
Comparison: [DATE]

CLINICAL DATA: Low back pain with right leg pain for 6-9 months

EXAM:
MRI LUMBAR SPINE WITHOUT CONTRAST
TECHNIQUE: Multiplanar, multisequence MR imaging of the lumbar spine was
performed. No intravenous contrast was administered.

[Series 5: T2 · sagittal · 4.0mm · 0.73mm/px · 6 of 15 slices shown (1 of 2)]
[im 1/15]
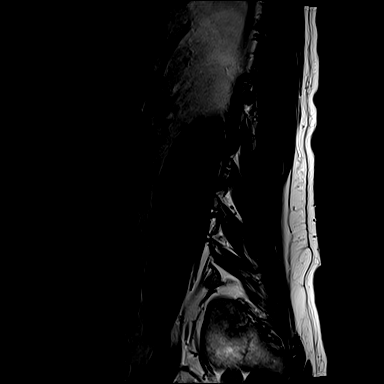
[im 3/15]
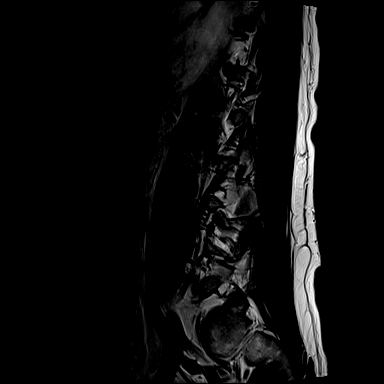
[im 6/15]
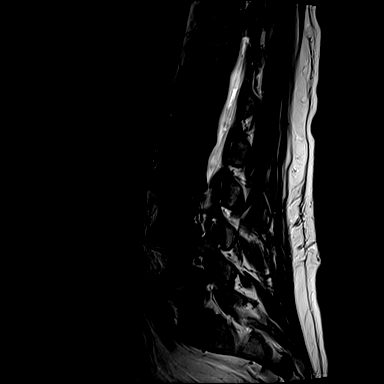
[im 9/15]
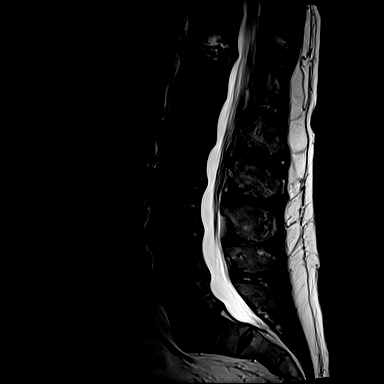
[im 12/15]
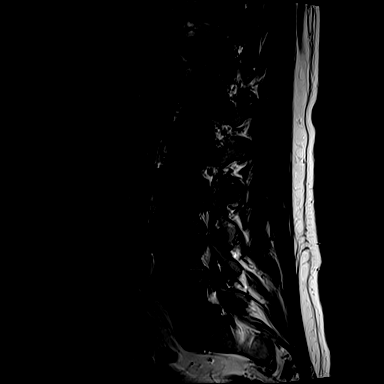
[im 15/15]
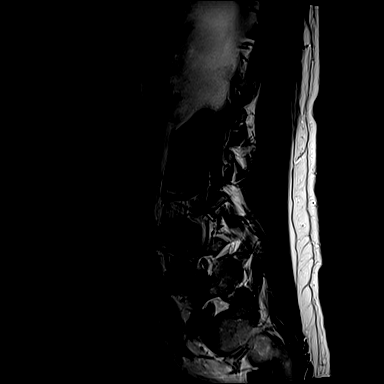

[Series 6: T1 · sagittal · 4.0mm · 0.73mm/px · 3 of 15 slices shown (1 of 2)]
[im 3/15]
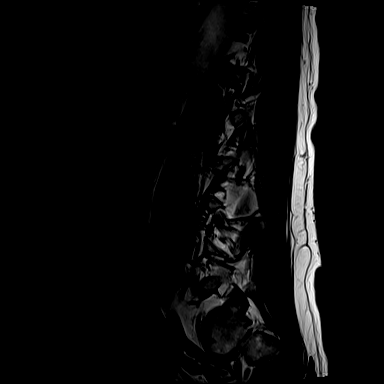
[im 9/15]
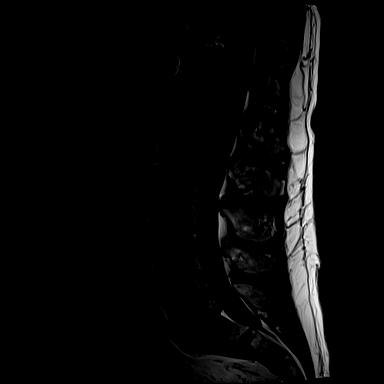
[im 15/15]
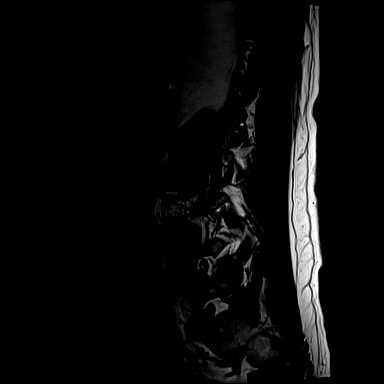

[Series 10: T1 · axial · 4.0mm · 0.28mm/px · z∈[-66,+108]mm · 3 of 39 slices shown (2 of 2)]
[im 6/39]
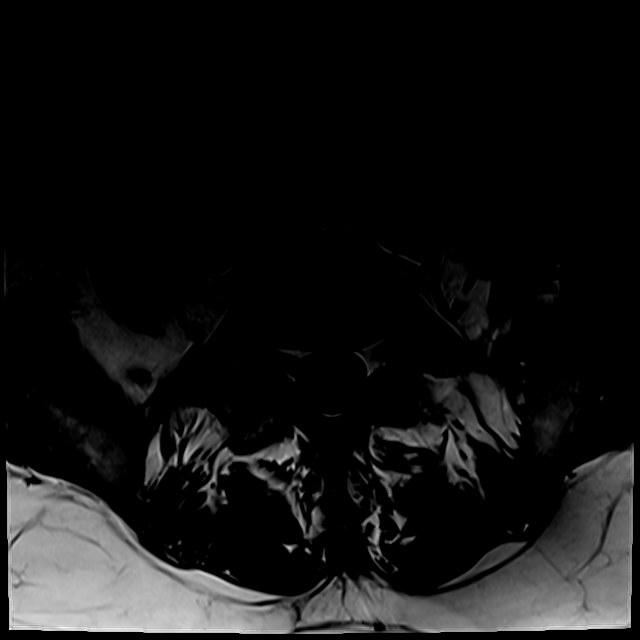
[im 20/39]
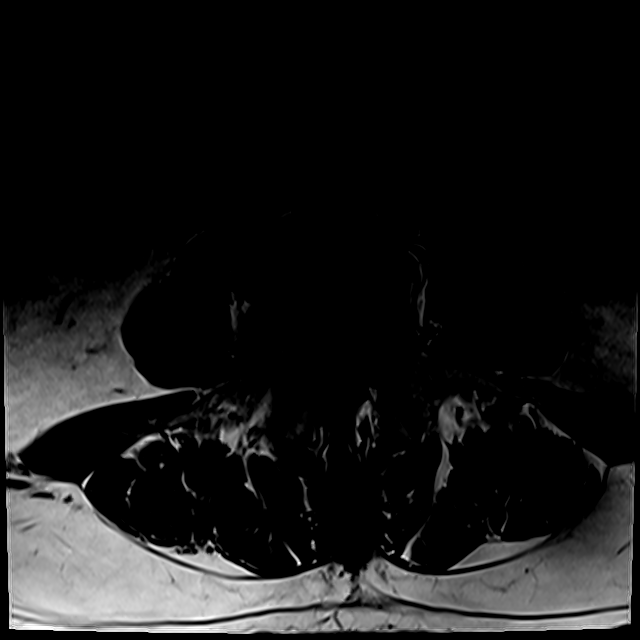
[im 33/39]
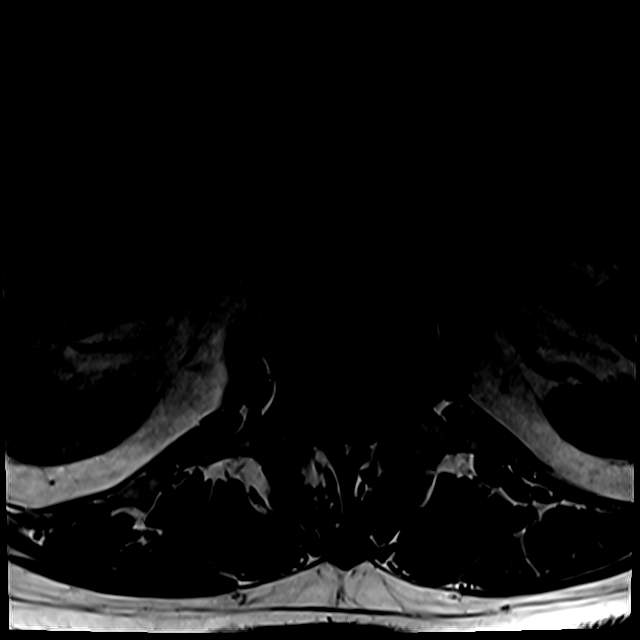

[Series 13: T2 · axial · 4.0mm · 0.28mm/px · z∈[-89,+108]mm · 7 of 39 slices shown (2 of 2)]
[im 1/39]
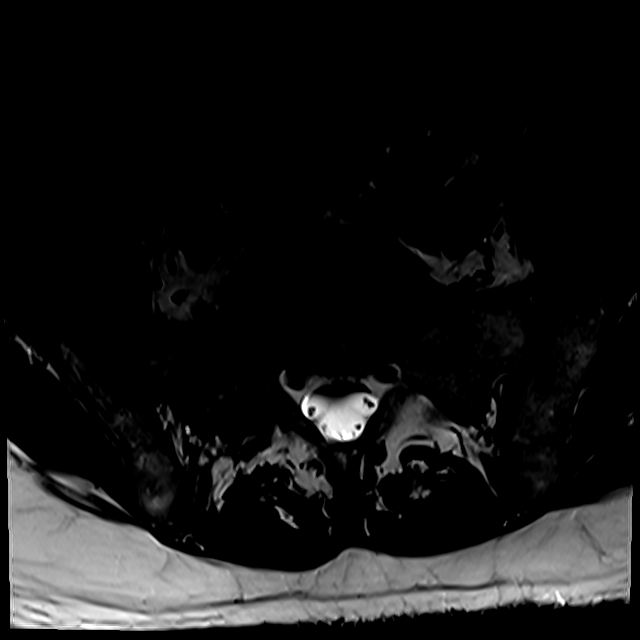
[im 6/39]
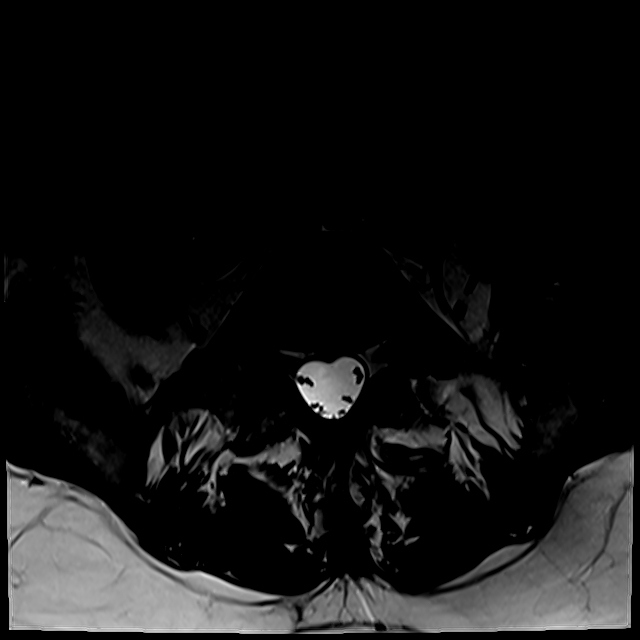
[im 11/39]
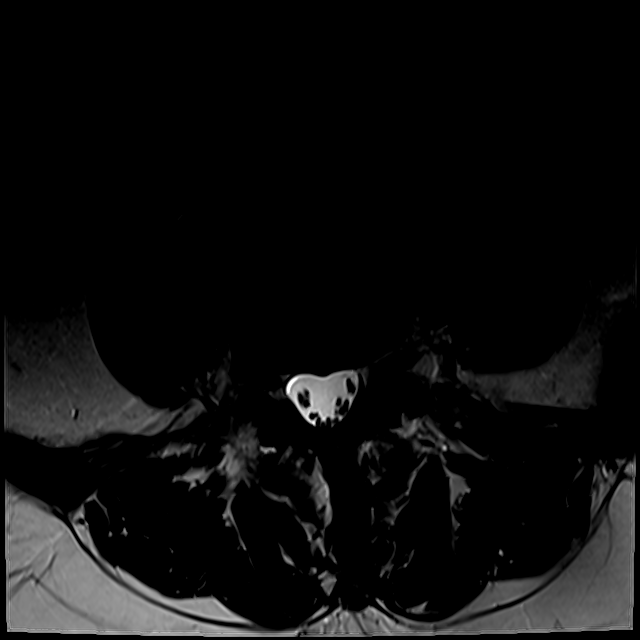
[im 17/39]
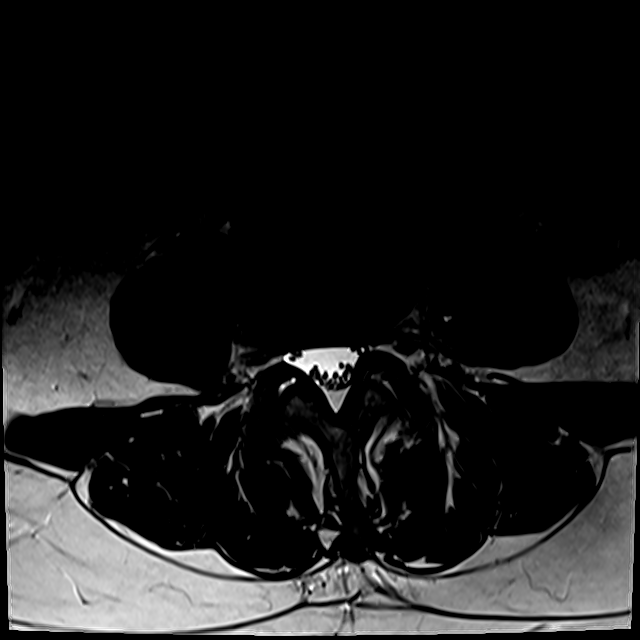
[im 20/39]
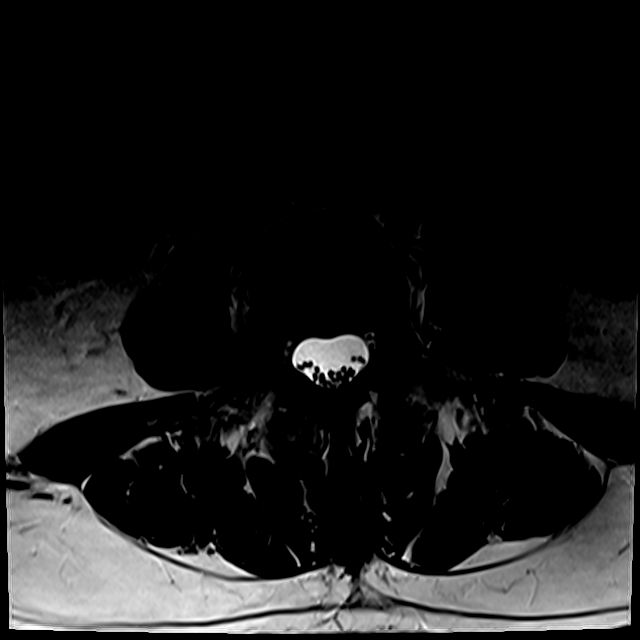
[im 22/39]
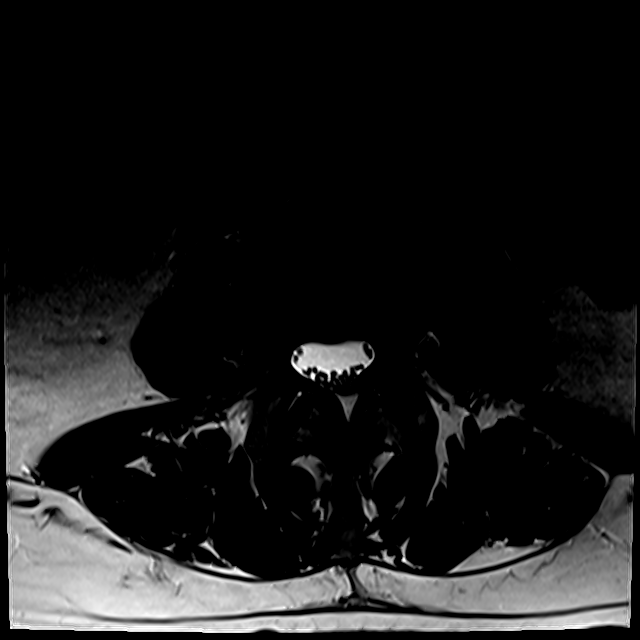
[im 33/39]
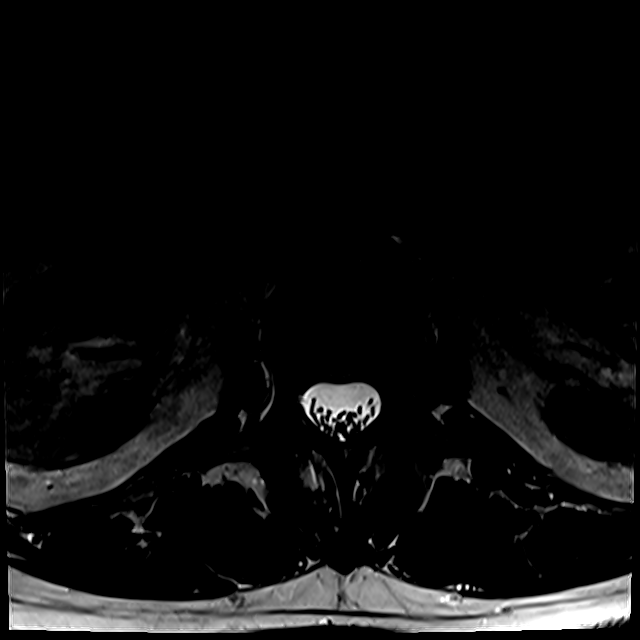

[19 of 48 positions shown; findings below may reference images not displayed]

FINDINGS: Segmentation:  Standard.

Alignment:  Physiologic.

Vertebrae:  No fracture, evidence of discitis, or bone lesion.

Conus medullaris and cauda equina: Conus extends to the L1 level.
Conus and cauda equina appear normal.

Paraspinal and other soft tissues: Negative for perispinal mass or
inflammation.

Disc levels:

T12- L1: Unremarkable.

L1-L2: Mild disc narrowing and bulging.

L2-L3: Disc narrowing and right eccentric bulging. Posterior annular
fissure.

L3-L4: Disc narrowing and bulging.  Facet spurring on both sides.

L4-L5: Disc narrowing and foraminal predominant bulging with mild
facet spurring. Left paracentral herniation on prior has regressed.

L5-S1:Disc narrowing with central protrusion not causing neural
compression. Mild facet spurring asymmetric to the right.
IMPRESSION: Generalized lumbar spine degeneration without neural compression or
inflammation. No progression from [XK].

## 2023-06-04 ENCOUNTER — Ambulatory Visit (INDEPENDENT_AMBULATORY_CARE_PROVIDER_SITE_OTHER): Payer: Commercial Managed Care - PPO | Admitting: Podiatry

## 2023-06-04 ENCOUNTER — Ambulatory Visit (INDEPENDENT_AMBULATORY_CARE_PROVIDER_SITE_OTHER): Payer: Commercial Managed Care - PPO

## 2023-06-04 ENCOUNTER — Encounter: Payer: Self-pay | Admitting: Podiatry

## 2023-06-04 DIAGNOSIS — M62461 Contracture of muscle, right lower leg: Secondary | ICD-10-CM

## 2023-06-04 DIAGNOSIS — M722 Plantar fascial fibromatosis: Secondary | ICD-10-CM | POA: Diagnosis not present

## 2023-06-04 DIAGNOSIS — M779 Enthesopathy, unspecified: Secondary | ICD-10-CM | POA: Diagnosis not present

## 2023-06-04 DIAGNOSIS — M62462 Contracture of muscle, left lower leg: Secondary | ICD-10-CM | POA: Diagnosis not present

## 2023-06-04 NOTE — Patient Instructions (Signed)
VISIT SUMMARY:  Today, we discussed your heel and ankle pain, which is worse on your left foot. We reviewed your symptoms and noted tenderness in your arch and heel. We also discussed your work boots and their impact on your foot discomfort.  YOUR PLAN:  -PLANTAR FASCIITIS: Plantar fasciitis is inflammation of the tissue along the bottom of your foot, causing heel pain. We administered a cortisone injection to help reduce inflammation and pain. You should start a home exercise plan focusing on stretching and strengthening exercises. Additionally, it is important to get properly fitted shoes from a reputable store to ensure they provide adequate support for your work.  -GASTROCNEMIUS EQUINUS: Gastrocnemius equinus is tightness in the Achilles tendon, which can worsen plantar fasciitis by requiring more flexibility in your foot with each step. Your home exercise plan will include stretching exercises for your Achilles tendon to help alleviate this tightness.  -FOOTWEAR-RELATED DISCOMFORT: Your work boots are causing discomfort due to pressure on the sides of your feet. It is recommended that you get fitted for appropriate footwear at a reputable shoe store to ensure your boots provide the necessary support and comfort for your job.  INSTRUCTIONS:  Please follow the home exercise plan provided, focusing on stretching and strengthening exercises for your feet and Achilles tendon. Make sure to get fitted for proper footwear at a reputable shoe store. If your symptoms do not improve or if you have any concerns, please schedule a follow-up appointment.     Plantar Fasciitis (Heel Spur Syndrome) with Rehab The plantar fascia is a fibrous, ligament-like, soft-tissue structure that spans the bottom of the foot. Plantar fasciitis is a condition that causes pain in the foot due to inflammation of the tissue. SYMPTOMS  Pain and tenderness on the underneath side of the foot. Pain that worsens with standing  or walking. CAUSES  Plantar fasciitis is caused by irritation and injury to the plantar fascia on the underneath side of the foot. Common mechanisms of injury include: Direct trauma to bottom of the foot. Damage to a small nerve that runs under the foot where the main fascia attaches to the heel bone. Stress placed on the plantar fascia due to bone spurs. RISK INCREASES WITH:  Activities that place stress on the plantar fascia (running, jumping, pivoting, or cutting). Poor strength and flexibility. Improperly fitted shoes. Tight calf muscles. Flat feet. Failure to warm-up properly before activity. Obesity. PREVENTION Warm up and stretch properly before activity. Allow for adequate recovery between workouts. Maintain physical fitness: Strength, flexibility, and endurance. Cardiovascular fitness. Maintain a health body weight. Avoid stress on the plantar fascia. Wear properly fitted shoes, including arch supports for individuals who have flat feet.  PROGNOSIS  If treated properly, then the symptoms of plantar fasciitis usually resolve without surgery. However, occasionally surgery is necessary.  RELATED COMPLICATIONS  Recurrent symptoms that may result in a chronic condition. Problems of the lower back that are caused by compensating for the injury, such as limping. Pain or weakness of the foot during push-off following surgery. Chronic inflammation, scarring, and partial or complete fascia tear, occurring more often from repeated injections.  TREATMENT  Treatment initially involves the use of ice and medication to help reduce pain and inflammation. The use of strengthening and stretching exercises may help reduce pain with activity, especially stretches of the Achilles tendon. These exercises may be performed at home or with a therapist. Your caregiver may recommend that you use heel cups of arch supports to help reduce stress  on the plantar fascia. Occasionally, corticosteroid  injections are given to reduce inflammation. If symptoms persist for greater than 6 months despite non-surgical (conservative), then surgery may be recommended.   MEDICATION  If pain medication is necessary, then nonsteroidal anti-inflammatory medications, such as aspirin and ibuprofen, or other minor pain relievers, such as acetaminophen, are often recommended. Do not take pain medication within 7 days before surgery. Prescription pain relievers may be given if deemed necessary by your caregiver. Use only as directed and only as much as you need. Corticosteroid injections may be given by your caregiver. These injections should be reserved for the most serious cases, because they may only be given a certain number of times.  HEAT AND COLD Cold treatment (icing) relieves pain and reduces inflammation. Cold treatment should be applied for 10 to 15 minutes every 2 to 3 hours for inflammation and pain and immediately after any activity that aggravates your symptoms. Use ice packs or massage the area with a piece of ice (ice massage). Heat treatment may be used prior to performing the stretching and strengthening activities prescribed by your caregiver, physical therapist, or athletic trainer. Use a heat pack or soak the injury in warm water.  SEEK IMMEDIATE MEDICAL CARE IF: Treatment seems to offer no benefit, or the condition worsens. Any medications produce adverse side effects.  EXERCISES- RANGE OF MOTION (ROM) AND STRETCHING EXERCISES - Plantar Fasciitis (Heel Spur Syndrome) These exercises may help you when beginning to rehabilitate your injury. Your symptoms may resolve with or without further involvement from your physician, physical therapist or athletic trainer. While completing these exercises, remember:  Restoring tissue flexibility helps normal motion to return to the joints. This allows healthier, less painful movement and activity. An effective stretch should be held for at least 30  seconds. A stretch should never be painful. You should only feel a gentle lengthening or release in the stretched tissue.  RANGE OF MOTION - Toe Extension, Flexion Sit with your right / left leg crossed over your opposite knee. Grasp your toes and gently pull them back toward the top of your foot. You should feel a stretch on the bottom of your toes and/or foot. Hold this stretch for 10 seconds. Now, gently pull your toes toward the bottom of your foot. You should feel a stretch on the top of your toes and or foot. Hold this stretch for 10 seconds. Repeat  times. Complete this stretch 3 times per day.   RANGE OF MOTION - Ankle Dorsiflexion, Active Assisted Remove shoes and sit on a chair that is preferably not on a carpeted surface. Place right / left foot under knee. Extend your opposite leg for support. Keeping your heel down, slide your right / left foot back toward the chair until you feel a stretch at your ankle or calf. If you do not feel a stretch, slide your bottom forward to the edge of the chair, while still keeping your heel down. Hold this stretch for 10 seconds. Repeat 3 times. Complete this stretch 2 times per day.   STRETCH  Gastroc, Standing Place hands on wall. Extend right / left leg, keeping the front knee somewhat bent. Slightly point your toes inward on your back foot. Keeping your right / left heel on the floor and your knee straight, shift your weight toward the wall, not allowing your back to arch. You should feel a gentle stretch in the right / left calf. Hold this position for 10 seconds. Repeat 3 times. Complete  this stretch 2 times per day.  STRETCH  Soleus, Standing Place hands on wall. Extend right / left leg, keeping the other knee somewhat bent. Slightly point your toes inward on your back foot. Keep your right / left heel on the floor, bend your back knee, and slightly shift your weight over the back leg so that you feel a gentle stretch deep in your  back calf. Hold this position for 10 seconds. Repeat 3 times. Complete this stretch 2 times per day.  STRETCH  Gastrocsoleus, Standing  Note: This exercise can place a lot of stress on your foot and ankle. Please complete this exercise only if specifically instructed by your caregiver.  Place the ball of your right / left foot on a step, keeping your other foot firmly on the same step. Hold on to the wall or a rail for balance. Slowly lift your other foot, allowing your body weight to press your heel down over the edge of the step. You should feel a stretch in your right / left calf. Hold this position for 10 seconds. Repeat this exercise with a slight bend in your right / left knee. Repeat 3 times. Complete this stretch 2 times per day.   STRENGTHENING EXERCISES - Plantar Fasciitis (Heel Spur Syndrome)  These exercises may help you when beginning to rehabilitate your injury. They may resolve your symptoms with or without further involvement from your physician, physical therapist or athletic trainer. While completing these exercises, remember:  Muscles can gain both the endurance and the strength needed for everyday activities through controlled exercises. Complete these exercises as instructed by your physician, physical therapist or athletic trainer. Progress the resistance and repetitions only as guided.  STRENGTH - Towel Curls Sit in a chair positioned on a non-carpeted surface. Place your foot on a towel, keeping your heel on the floor. Pull the towel toward your heel by only curling your toes. Keep your heel on the floor. Repeat 3 times. Complete this exercise 2 times per day.  STRENGTH - Ankle Inversion Secure one end of a rubber exercise band/tubing to a fixed object (table, pole). Loop the other end around your foot just before your toes. Place your fists between your knees. This will focus your strengthening at your ankle. Slowly, pull your big toe up and in, making sure the  band/tubing is positioned to resist the entire motion. Hold this position for 10 seconds. Have your muscles resist the band/tubing as it slowly pulls your foot back to the starting position. Repeat 3 times. Complete this exercises 2 times per day.  Document Released: 04/08/2005 Document Revised: 07/01/2011 Document Reviewed: 07/21/2008 [facility] Patient Information 2014 [place], [place].

## 2023-06-05 ENCOUNTER — Encounter: Payer: Self-pay | Admitting: Podiatry

## 2023-06-05 NOTE — Progress Notes (Signed)
  Subjective:  Patient ID: James Summers, male    DOB: [birth year],  MRN: [id number]  Chief Complaint  Patient presents with   Foot Pain    "I have pain in both of my heels.  The pain travels to my ankles.  The left one is worst than the right."    Discussed the use of AI scribe software for clinical note transcription with the patient, who gave verbal consent to proceed.  History of Present Illness   Joahan Swatzell is a 63 year old male with plantar fasciitis who presents with heel and ankle pain.  He experiences heel and ankle pain, with the left foot being worse than the right. The pain is located at both the bottom and back of the heel. This episode feels different from previous experiences with plantar fasciitis, as his heel did not hurt during past episodes, only his ankles did. No significant pain in the arch itself, but there is some pain in the midarch and plantar medial band of the plantar fascia.  He works as a buried Librarian, academic, which requires frequent movement and standing. He wears work boots with safety toes and puncture-resistant insoles, but he does not believe they provide much arch support. He replaces his boots approximately every year. Previous pairs of boots caused bumps on both feet due to pressure, resulting in soreness.  He cannot take medications containing ibuprofen due to a history of stomach ulcers, but he can take Tylenol.          Objective:    Physical Exam   EXTREMITIES: Pulses palpable in feet, good capillary fill time. MUSCULOSKELETAL: Tenderness on palpation of the plantar medial band and midarch area bilaterally. Gastrocnemius equinus bilaterally with tightness in the Achilles tendon.       No images are attached to the encounter.    Results   RADIOLOGY Bilateral foot x-ray: Plantar calcaneal spur formation. No evidence of fracture or stress fracture. (06/04/2023)      Assessment:   1. Plantar fasciitis   2. Gastrocnemius  equinus of left lower extremity   3. Gastrocnemius equinus of right lower extremity      Plan:  Patient was evaluated and treated and all questions answered.  Assessment and Plan    Plantar Fasciitis Pain in the heel and ankle, worse on the left. Noted tenderness in the arch and heel. X-ray shows plantar cranial spur formation. Discussed the importance of consistent footwear and the role of physical therapy in managing the condition. -Administer cortisone injection to reduce inflammation and pain. -Initiate home exercise plan focusing on stretching and strengthening exercises. -Recommend shoe fitting at a reputable shoe store to ensure appropriate footwear for work.  Gastrocnemius Equinus Tightness in the Achilles tendon bilaterally. This condition can exacerbate plantar fasciitis by increasing the flexibility required in the foot with each step. -Include stretching exercises for the Achilles tendon in the home exercise plan.  Footwear-related discomfort Noted discomfort related to work boots, including pressure on the side of the foot. -Recommend shoe fitting at a reputable shoe store to ensure appropriate footwear for work.       After sterile prep with povidone-iodine solution and alcohol, the bilateral heel was injected with 0.5cc 2% xylocaine plain, 0.5cc 0.5% marcaine plain, 10mg  triamcinolone acetonide, and 4mg  dexamethasone was injected along the medial plantar fascia at the insertion on the plantar calcaneus. The patient tolerated the procedure well without complication.    Return if symptoms worsen or fail to improve.

## 2023-06-20 ENCOUNTER — Telehealth: Payer: Self-pay | Admitting: Podiatry

## 2023-06-20 DIAGNOSIS — M629 Disorder of muscle, unspecified: Secondary | ICD-10-CM

## 2023-06-20 NOTE — Telephone Encounter (Signed)
 Pt called and since seeing you he has been working and in the last week his left foot is in a lot of pain at the end of the day. He is wanting to know if you think it would be good for him to take a day or some time off work to rest the foot.

## 2023-06-26 NOTE — Addendum Note (Signed)
 Addended byLilian Kapur, Lexey Fletes R on: 06/26/2023 09:59 AM   Modules accepted: Orders

## 2023-07-02 ENCOUNTER — Encounter: Payer: Self-pay | Admitting: Podiatry

## 2023-07-05 ENCOUNTER — Ambulatory Visit
Admission: RE | Admit: 2023-07-05 | Discharge: 2023-07-05 | Disposition: A | Discharge status: Home / Self Care | Source: Ambulatory Visit | Attending: Podiatry | Admitting: Podiatry

## 2023-07-05 DIAGNOSIS — M629 Disorder of muscle, unspecified: Secondary | ICD-10-CM

## 2023-07-07 ENCOUNTER — Telehealth: Payer: Self-pay

## 2023-07-07 ENCOUNTER — Encounter: Payer: Self-pay | Admitting: Podiatry

## 2023-07-07 NOTE — Telephone Encounter (Signed)
 Patient called - he had his MRI over the weekend, asking for the results. I explained that we ar waiting for the report, for the radiologist to read the MRI. He asked if you could take a look and the images and tell him what you see. I explained that it doesn't work that way. He was upset, asked what he was supposed to do in the meantime. Told him I would send you a message.

## 2023-07-08 ENCOUNTER — Other Ambulatory Visit: Payer: Self-pay | Admitting: Podiatry

## 2023-07-08 DIAGNOSIS — M10472 Other secondary gout, left ankle and foot: Secondary | ICD-10-CM

## 2023-07-08 MED ORDER — MELOXICAM 15 MG PO TABS: 15.0000 mg | ORAL_TABLET | Freq: Every day | ORAL | 3 refills | Status: AC

## 2023-07-14 ENCOUNTER — Telehealth: Payer: Self-pay | Admitting: Podiatry

## 2023-07-14 NOTE — Telephone Encounter (Signed)
 Patients wife called she is asking if we can expedite the MRI results because her husband is out of work and they have to pay their bills so they need to get those results back.

## 2023-07-22 ENCOUNTER — Encounter: Payer: Self-pay | Admitting: Podiatry

## 2023-07-22 ENCOUNTER — Telehealth: Payer: Self-pay

## 2023-07-22 DIAGNOSIS — Z0271 Encounter for disability determination: Secondary | ICD-10-CM

## 2023-07-22 LAB — CBC WITH DIFFERENTIAL/PLATELET
Basophils Absolute: 0.1 10*3/uL (ref 0.0–0.2)
Basos: 1 %
EOS (ABSOLUTE): 0.3 10*3/uL (ref 0.0–0.4)
Eos: 3 %
Hematocrit: 48.1 % (ref 37.5–51.0)
Hemoglobin: 16 g/dL (ref 13.0–17.7)
Immature Grans (Abs): 0 10*3/uL (ref 0.0–0.1)
Immature Granulocytes: 0 %
Lymphocytes Absolute: 2 10*3/uL (ref 0.7–3.1)
Lymphs: 26 %
MCH: 30.2 pg (ref 26.6–33.0)
MCHC: 33.3 g/dL (ref 31.5–35.7)
MCV: 91 fL (ref 79–97)
Monocytes Absolute: 0.7 10*3/uL (ref 0.1–0.9)
Monocytes: 9 %
Neutrophils Absolute: 4.8 10*3/uL (ref 1.4–7.0)
Neutrophils: 61 %
Platelets: 299 10*3/uL (ref 150–450)
RBC: 5.3 x10E6/uL (ref 4.14–5.80)
RDW: 12 % (ref 11.6–15.4)
WBC: 7.9 10*3/uL (ref 3.4–10.8)

## 2023-07-22 LAB — URIC ACID: Uric Acid: 5.5 mg/dL (ref 3.8–8.4)

## 2023-07-22 LAB — SEDIMENTATION RATE: Sed Rate: 6 mm/h (ref 0–30)

## 2023-07-22 NOTE — Telephone Encounter (Signed)
 Called patient at [phone] and advised him I recd forms from [employer]. He said he is waiting on MRI results. Leave time (been OOW since 07/01/23)-09/22/23. I advised would fax [phone] completed forms and ok to email wife's emai [email or web address]

## 2023-07-22 NOTE — Telephone Encounter (Signed)
 Patient sent a MyChart message, anxious for the results of MRI -I called the reading room and they will flag it as urgent -just [facility]

## 2023-07-23 NOTE — Telephone Encounter (Signed)
 Recd more forms from [employer]. Faxed again with notes to [phone].

## 2023-07-28 ENCOUNTER — Telehealth: Payer: Self-pay | Admitting: Podiatry

## 2023-07-28 DIAGNOSIS — M7672 Peroneal tendinitis, left leg: Secondary | ICD-10-CM

## 2023-07-28 DIAGNOSIS — M76822 Posterior tibial tendinitis, left leg: Secondary | ICD-10-CM

## 2023-07-28 DIAGNOSIS — M722 Plantar fascial fibromatosis: Secondary | ICD-10-CM

## 2023-07-28 NOTE — Telephone Encounter (Signed)
 Called the patient and discussed his MRI results we discussed the plantar fasciitis that is still showing on his MRI but this is doing better he says most of the pain is on the top of the foot and the outside of the foot near the ankle, I MRI report did not note any peroneal tendinopathy but there is some fluid in the peroneal tendons, I discussed with him that he should continue the meloxicam and likely would benefit from physical therapy.  Referral placed to PT outpatient at [facility].  He will need follow-up visit scheduled with me to reevaluate in 1 month

## 2023-07-28 NOTE — Telephone Encounter (Signed)
 Patient is requesting to speak with Dr. Lilian Kapur regarding MRI results from 07/05/23. Patient contact telephone number [phone]

## 2023-07-30 ENCOUNTER — Telehealth: Payer: Self-pay | Admitting: Podiatry

## 2023-07-30 NOTE — Telephone Encounter (Signed)
 Wife called. The PT location that the referral was sent [facility] is booked until May. She wants to know if she can get a copy of the referral that has no specific place and she can call around to see who take him sooner.   She does have access to his MyChart if you can upload it there.

## 2023-07-30 NOTE — Addendum Note (Signed)
 Addended byLilian Kapur, Kethan Papadopoulos R on: 07/30/2023 12:45 PM   Modules accepted: Orders

## 2023-07-30 NOTE — Telephone Encounter (Signed)
 I will let them they know they will have to pick it up from the front desk. Let me know when ready and I will call them. TY

## 2023-08-06 ENCOUNTER — Telehealth: Payer: Self-pay | Admitting: Podiatry

## 2023-08-06 NOTE — Telephone Encounter (Signed)
 The wife called regarding her husband's FMLA paperwork, which needs to be returned within 10 days to avoid him being dropped. I explained to her that the paperwork will need to be dropped off at the office. There is a $25 fee for this service, which is required at the time of submission. If additional paperwork is needed in the future, there will be a $10 fee each time. The turnaround time for completion is 10-14 days. I confirmed that once completed, she would like it faxed to the number listed on the forms.  She expressed concern about Raquel being on vacation, but I assured her that Raquel will return in time to complete the paperwork within the allotted 10-14 day timeframe.

## 2023-08-13 NOTE — Telephone Encounter (Signed)
 Faxed [employer] forms/notes to [phone]

## 2023-08-21 ENCOUNTER — Ambulatory Visit (HOSPITAL_COMMUNITY)

## 2023-08-27 ENCOUNTER — Encounter: Payer: Self-pay | Admitting: Podiatry

## 2023-08-27 ENCOUNTER — Ambulatory Visit (INDEPENDENT_AMBULATORY_CARE_PROVIDER_SITE_OTHER): Admitting: Podiatry

## 2023-08-27 DIAGNOSIS — M11272 Other chondrocalcinosis, left ankle and foot: Secondary | ICD-10-CM

## 2023-08-27 DIAGNOSIS — M25572 Pain in left ankle and joints of left foot: Secondary | ICD-10-CM

## 2023-08-27 DIAGNOSIS — M11279 Other chondrocalcinosis, unspecified ankle and foot: Secondary | ICD-10-CM

## 2023-08-27 NOTE — Progress Notes (Signed)
 Subjective:  Patient ID: James Summers, male    DOB: 1961/04/03,  MRN: [id number]  Chief Complaint  Patient presents with   Plantar Fasciitis    "It's better than it was but it's still not right."    63 y.o. male presents with the above complaint. History confirmed with patient.  He returns for follow-up.  Total he wore the boot for a few weeks.  He says he has not taken the meloxicam .  Has been doing physical therapy at [facility] in [place] which has helped, he feels that therapy is inconsistent because he sees a different person every time.  Scheduled return to work next month  Objective:  Physical Exam: warm, good capillary refill, no trophic changes or ulcerative lesions, normal DP and PT pulses, normal sensory exam, and today no pain on palpation to the plantar fascia some medial PT tendon pain he has pain primarily over the sinus tarsi that is worse with inversion eversion of the joint.   Narrative & Impression  CLINICAL DATA:  Ankle pain, suspected tear   EXAM: MR OF THE LEFT ANKLE WITHOUT CONTRAST   TECHNIQUE: Multiplanar, multisequence MR imaging of the ankle was performed. No intravenous contrast was administered.   COMPARISON:  Radiographs 06/04/2023   FINDINGS: TENDONS   Peroneal: Unremarkable   Posteromedial: Mild distal tibialis posterior tendinopathy.   Anterior: Unremarkable   Achilles: Unremarkable   Plantar Fascia: Subtle accentuated signal in the medial band of the plantar fascia with borderline widening compatible with mild plantar fasciitis. Plantar calcaneal spur noted.   LIGAMENTS   Lateral: Unremarkable   Medial: Unremarkable   CARTILAGE   Ankle Joint: Unremarkable   Subtalar Joints/Sinus Tarsi: Unremarkable   Bones: No significant extra-articular osseous abnormalities identified.   Other: Subcutaneous edema medially and laterally along the ankle and tracking into the dorsum of the foot.   IMPRESSION: 1. Mild distal tibialis  posterior tendinopathy. Correlate clinically in assessing for tibialis posterior dysfunction. 2. Mild plantar fasciitis. 3. Subcutaneous edema medially and laterally along the ankle and tracking into the dorsum of the foot.     Electronically Signed   By: Freida Jes M.D.   On: 07/25/2023 13:50      Lab results 07/21/2023.  CBC unremarkable.  Sed rate 6.  Uric acid 5.5.  Assessment:   1. 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  He returns for follow-up his plantar fasciitis has largely resolved, his MRI showed the plantar fasciitis but no other major abnormalities with exception of mild PT tendinitis.  Most of his pain is over the sinus tarsi.  His labs were negative for gout we discussed the possibility of pseudogout as well as sinus tarsi syndrome and impingement.  I recommended corticosteroid injection.  Following consent and discussion of this the left sinus tarsi and subtalar joint was prepped with Betadine and using ethyl chloride spray was injected with 20 mg of Kenalog  4 mg of dexamethasone  and 5 mg of Marcaine.  He tolerated this well and was dressed with a Band-Aid.  He may use regular shoe gear as tolerated I did advise him that I think the meloxicam  is beneficial to take for therapeutic anti-inflammatory effect for the next few weeks.  May return to work as scheduled first week of June he will let me know if he has not ready for this or if we  need to follow-up in the injection with an oral steroid.  Return in about 6 weeks (around 10/08/2023) for follow up left foot pain.

## 2023-09-11 NOTE — Telephone Encounter (Signed)
 Wife had lft mess on my vmail. Called bck and s/w her and advised her of intermittent leave and accommodations if needed. RTW is 09/22/23. They will call me bck if needs to be revised. She said the shot helped him alot, but will see if 09/22/23 is still good

## 2023-09-12 ENCOUNTER — Encounter: Payer: Self-pay | Admitting: Podiatry

## 2023-09-12 NOTE — Telephone Encounter (Signed)
 cld pt bck from mess he lft. I clld him back 09/11/23. He will need to go bk with accommodation allowed to complee less than expected in the reg hrs based on his med condition. RTW 09/22/23. Faxed letter to [employer] [phone] and Linda/[employer] [phone]

## 2023-09-16 NOTE — Telephone Encounter (Signed)
 cld pt/wife back from mess lft on my vmail. he is emailing me form for his company. I adv woulld fax it # included on form if not I will email it back to him

## 2023-09-18 DIAGNOSIS — Z0271 Encounter for disability determination: Secondary | ICD-10-CM

## 2023-09-18 NOTE — Telephone Encounter (Signed)
 Recd [employer] form from pt via email. Faxed to [employer] [phone] with notes/letter

## 2023-09-22 NOTE — Telephone Encounter (Signed)
 cld pt bk from mess lft. He is saying HR wants specifics. He is waiting on his supervisor to let him know how should be worded.He feels he can go bck no restrictions in 1 mth. We discussed thru 12/25. I adv can change but he needs to call me bk  I adv him [employer] sent forms as well. He said to hold off on both till he calls me back. Adv would do one today if he calls me back. Forms are done in the order in which they are recd and there are pts in front of him.

## 2023-09-23 NOTE — Telephone Encounter (Signed)
 pt lft mess again on vmail. Called him back and he has not heard back from his employer. He advised to revise [employer] form RTW 09/24/23 with no restrictions. I updated and refaxed to [phone] and emailed his wife copy of revised.

## 2023-10-08 ENCOUNTER — Encounter: Payer: Self-pay | Admitting: Podiatry

## 2023-10-08 ENCOUNTER — Ambulatory Visit (INDEPENDENT_AMBULATORY_CARE_PROVIDER_SITE_OTHER): Admitting: Podiatry

## 2023-10-08 DIAGNOSIS — M25572 Pain in left ankle and joints of left foot: Secondary | ICD-10-CM | POA: Diagnosis not present

## 2023-10-08 DIAGNOSIS — M11279 Other chondrocalcinosis, unspecified ankle and foot: Secondary | ICD-10-CM | POA: Diagnosis not present

## 2023-10-08 NOTE — Progress Notes (Signed)
  Subjective:  Patient ID: James Summers, male    DOB: Feb 13, 1961,  MRN: [id number]  Chief Complaint  Patient presents with   Foot Pain    Pt is here to f/u on left foot pain he states the pain has gotten better.    63 y.o. male presents with the above complaint. History confirmed with patient.  He returns for follow-up.  He is doing much better he has had near complete illumination of his pain after the first day or 2  Objective:  Physical Exam: warm, good capillary refill, no trophic changes or ulcerative lesions, normal DP and PT pulses, normal sensory exam, and today no pain on palpation to the plantar fascia the PT tendon or the sinus tarsi   Narrative & Impression  CLINICAL DATA:  Ankle pain, suspected tear   EXAM: MR OF THE LEFT ANKLE WITHOUT CONTRAST   TECHNIQUE: Multiplanar, multisequence MR imaging of the ankle was performed. No intravenous contrast was administered.   COMPARISON:  Radiographs 06/04/2023   FINDINGS: TENDONS   Peroneal: Unremarkable   Posteromedial: Mild distal tibialis posterior tendinopathy.   Anterior: Unremarkable   Achilles: Unremarkable   Plantar Fascia: Subtle accentuated signal in the medial band of the plantar fascia with borderline widening compatible with mild plantar fasciitis. Plantar calcaneal spur noted.   LIGAMENTS   Lateral: Unremarkable   Medial: Unremarkable   CARTILAGE   Ankle Joint: Unremarkable   Subtalar Joints/Sinus Tarsi: Unremarkable   Bones: No significant extra-articular osseous abnormalities identified.   Other: Subcutaneous edema medially and laterally along the ankle and tracking into the dorsum of the foot.   IMPRESSION: 1. Mild distal tibialis posterior tendinopathy. Correlate clinically in assessing for tibialis posterior dysfunction. 2. Mild plantar fasciitis. 3. Subcutaneous edema medially and laterally along the ankle and tracking into the dorsum of the foot.     Electronically Signed    By: Freida Jes M.D.   On: 07/25/2023 13:50      Lab results 07/21/2023.  CBC unremarkable.  Sed rate 6.  Uric acid 5.5.  Assessment:   1. 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  Improved significantly with sinus tarsi injection.  We discussed the possibility of recurrence he will be know if this occurs.  He may discontinue the meloxicam  at any point.  Follow-up with me as needed.  No follow-ups on file.

## 2023-11-05 ENCOUNTER — Telehealth: Payer: Self-pay | Admitting: Podiatry

## 2023-11-05 DIAGNOSIS — Z0271 Encounter for disability determination: Secondary | ICD-10-CM

## 2023-11-05 NOTE — Telephone Encounter (Signed)
 Recd fax from [employer]. Faxed forms/notes [phone]
